# Patient Record
Sex: Male | Born: 2005 | Race: Black or African American | Hispanic: No | Marital: Single | Smoking: Never smoker
Health system: Southern US, Community
[De-identification: ages and names within clinical notes are randomized; demographics above are authoritative.]

## PROBLEM LIST (undated history)

## (undated) ENCOUNTER — Emergency Department (HOSPITAL_COMMUNITY): Admission: EM | Payer: Medicaid Other | Source: Home / Self Care

## (undated) DIAGNOSIS — Q059 Spina bifida, unspecified: Secondary | ICD-10-CM

## (undated) HISTORY — PX: VENTRICULOPERITONEAL SHUNT: SHX204

---
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
## 2023-06-17 ENCOUNTER — Encounter: Payer: Medicaid Other | Attending: Internal Medicine | Admitting: Internal Medicine

## 2023-06-17 ENCOUNTER — Other Ambulatory Visit: Payer: Self-pay | Admitting: Internal Medicine

## 2023-06-17 ENCOUNTER — Ambulatory Visit
Admission: RE | Admit: 2023-06-17 | Discharge: 2023-06-17 | Disposition: A | Discharge status: Home / Self Care | Payer: Medicaid Other | Attending: Internal Medicine | Admitting: Internal Medicine

## 2023-06-17 ENCOUNTER — Ambulatory Visit
Admission: RE | Admit: 2023-06-17 | Discharge: 2023-06-17 | Disposition: A | Discharge status: Home / Self Care | Payer: Medicaid Other | Source: Ambulatory Visit | Attending: Internal Medicine | Admitting: Internal Medicine

## 2023-06-17 DIAGNOSIS — M86171 Other acute osteomyelitis, right ankle and foot: Secondary | ICD-10-CM | POA: Diagnosis present

## 2023-06-17 DIAGNOSIS — Q0701 Arnold-Chiari syndrome with spina bifida: Secondary | ICD-10-CM | POA: Diagnosis not present

## 2023-06-17 DIAGNOSIS — L89613 Pressure ulcer of right heel, stage 3: Secondary | ICD-10-CM | POA: Diagnosis present

## 2023-06-17 DIAGNOSIS — Z981 Arthrodesis status: Secondary | ICD-10-CM | POA: Diagnosis not present

## 2023-06-18 NOTE — Progress Notes (Addendum)
[name] ([id number]) [id number] Page 1 of 9 Visit Report for 06/17/2023 Chief Complaint Document Details Patient Name: Date of Service: [name] 06/17/2023 10:30 A M Medical Record Number: [id number] Patient Account Number: [id number] Date of Birth/Sex: Treating RN: May 16, 2006 (17 y.o. [name] Primary Care Provider: PA Zenovia Jordan, NO Other Clinician: Referring Provider: Treating Provider/Extender: RO BSO N, MICHA EL G GINGRICH, KRISTA Weeks in Treatment: 0 Information Obtained from: Patient Chief Complaint Pressure ulcer left foot due to brace 06/17/2023; patient is here for review of a pressure area on the right plantar heel Electronic Signature(s) Signed: 06/18/2023 3:33:32 PM By: Baltazar Najjar MD Entered By: Baltazar Najjar on 06/17/2023 12:10:17 -------------------------------------------------------------------------------- Debridement Details Patient Name: Date of Service: [name] 06/17/2023 10:30 A M Medical Record Number: [id number] Patient Account Number: [id number] Date of Birth/Sex: Treating RN: Sep 15, 2005 (17 y.o. [name] Primary Care Provider: PA Zenovia Jordan, NO Other Clinician: Referring Provider: Treating Provider/Extender: RO BSO N, MICHA EL G GINGRICH, KRISTA Weeks in Treatment: 0 Debridement Performed for Assessment: Wound #2 Right Calcaneus Performed By: Physician Maxwell Caul, MD Debridement Type: Debridement Level of Consciousness (Pre-procedure): Awake and Alert Pre-procedure Verification/Time Out Yes - 11:19 Taken: Start Time: 11:19 Pain Control: Lidocaine 4% T opical Solution Percent of Wound Bed Debrided: 100% T Area Debrided (cm): otal 0.82 Tissue and other material debrided: Viable, Non-Viable, Eschar, Subcutaneous, Skin: Dermis Level: Skin/Subcutaneous Tissue Debridement Description: Excisional Instrument: Curette Specimen: Swab, Number of Specimens T aken: 1 Bleeding:  Moderate Hemostasis Achieved: Silver Nitrate [name] ([id number]) [id number] Page 2 of 9 Procedural Pain: 0 Post Procedural Pain: 0 Response to Treatment: Procedure was tolerated well Level of Consciousness (Post- Awake and Alert procedure): Post Debridement Measurements of Total Wound Length: (cm) 1.5 Stage: Category/Stage III Width: (cm) 0.7 Depth: (cm) 0.6 Volume: (cm) 0.495 Character of Wound/Ulcer Post Debridement: Stable Post Procedure Diagnosis Same as Pre-procedure Electronic Signature(s) Signed: 06/18/2023 3:33:32 PM By: Baltazar Najjar MD Signed: 06/18/2023 4:52:24 PM By: Midge Aver MSN RN CNS WTA Entered By: Baltazar Najjar on 06/17/2023 12:24:13 -------------------------------------------------------------------------------- HPI Details Patient Name: Date of Service: [name] 06/17/2023 10:30 A M Medical Record Number: [id number] Patient Account Number: [id number] Date of Birth/Sex: Treating RN: 2006/03/26 (17 y.o. [name] Primary Care Provider: PA Zenovia Jordan, NO Other Clinician: Referring Provider: Treating Provider/Extender: RO BSO N, MICHA EL G GINGRICH, KRISTA Weeks in Treatment: 0 History of Present Illness HPI Description: 01/20/2020 upon evaluation today patient presents for initial inspection here in our clinic concerning issues that he has been having with his left foot laterally at the fifth metatarsal location. It appears that he has issues unfortunately with chronic rubbing on his brace. He does have spina bifida and he has issues with this fairly frequently. With that being said the good news is this does not appear to be infected. The bad news is it is a recurrent issue. He is can be having surgery for foot reconstruction June 8. 01/27/2020 on evaluation today patient appears to be doing about the same in regard to his wound at this point. Fortunately there is no signs of active infection. Overall I feel  like he is actually managing quite well. The wound is very macerated I think this is simply due to the fact that he has been using a Band-Aid as opposed to an absorptive bandage over top of the collagen. He really has nothing to drain into and then been putting over  top of this Covan which is occluding it even more. 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 He wears an AFO brace on both feet. Noteworthy that he was here in 2021 for an area on the left foot He tells me that he has had a wound on the plantar right heel for the better part of a year. Prior to this this may have opened and closed repetitively. He has not been specifically dressing this and he became alarmed when it would not close and also that there may have been an odor noticeable. He is here for a review of this. 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 ABI in our clinic was 1.22 on the right Electronic Signature(s) Signed: 06/18/2023 3:33:32 PM By: Baltazar Najjar MD Entered By: Baltazar Najjar on 06/17/2023 12:12:57 [name] ([id number]) [id number] Page 3 of 9 -------------------------------------------------------------------------------- Physical Exam Details Patient Name: Date of Service: [name] 06/17/2023 10:30 A M Medical Record Number: [id number] Patient Account Number: [id number] Date of Birth/Sex: Treating RN: June 15, 2006 (17 y.o. [name] Primary Care Provider: PA Zenovia Jordan, NO Other Clinician: Referring Provider: Treating Provider/Extender: RO BSO N, MICHA EL G GINGRICH, KRISTA Weeks in  Treatment: 0 Constitutional Sitting or standing Blood Pressure is within target range for patient.. Pulse regular and within target range for patient.Marland Kitchen Respirations regular, non-labored and within target range.. Temperature is normal and within the target range for the patient.Marland Kitchen appears in no distress. Respiratory Respiratory effort is easy and symmetric bilaterally. Rate is normal at rest and on room air.. Cardiovascular Pedal pulses are palpable on the right. Notes Wound exam; the areas in the center of the plantar heel. Thick rims of callus around the edge and undermining noted. This does not probe to bone. There is indeed an odor but no purulent drainage. I used a #5 curette to pare down the callus and then remove the overhanging skin. I pared down the resultant subcutaneous tissue. Obtained a specimen for PCR culture. After the procedure this did not probe to bone there was no gross purulence. I noted that his gait really looked surprisingly normal. He has ordinary running shoes on his feet. Electronic Signature(s) Signed: 06/18/2023 3:33:32 PM By: Baltazar Najjar MD Entered By: Baltazar Najjar on 06/17/2023 12:21:59 -------------------------------------------------------------------------------- Physician Orders Details Patient Name: Date of Service: [name] 06/17/2023 10:30 A M Medical Record Number: [id number] Patient Account Number: [id number] Date of Birth/Sex: Treating RN: Dec 28, 2005 (17 y.o. 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 Bathing/ Shower/ Hygiene Clean wound with Normal Saline or wound cleanser. Anesthetic (Use 'Patient Medications' Section for Anesthetic Order Entry) Lidocaine applied to wound bed Off-Loading Other: - Heel offloading shoe  Wound Treatment Wound #2 - Calcaneus Wound Laterality: Right Cleanser: Soap and Water 3 x Per Week/30 Days Discharge Instructions: Gently cleanse wound with antibacterial soap, rinse and pat dry prior to dressing wounds Cleanser: Vashe 5.8 (oz) 3 x Per Week/30 Days Discharge Instructions: Use vashe 5.8 (oz) as directed Prim Dressing: Hydrofera Blue Ready Transfer Foam, 2.5x2.5 (in/in) 3 x Per Week/30 Days ary Discharge Instructions: Apply Hydrofera Blue Ready to wound bed as directed Secondary Dressing: ABD Pad 5x9 (in/in) 3 x Per Week/30 Days Discharge Instructions: Cover with ABD pad Secondary Dressing: Heel Cup 3 x Per Week/30 Days Secured With: Kerlix Roll Sterile or Non-Sterile 6-ply 4.5x4 (yd/yd) 3 x Per Week/30 Days Discharge Instructions: Apply Kerlix as directed Laboratory Bacteria identified in Wound by Culture (MICRO) LOINC Code: [id number] Convenience Name: Wound culture routine Radiology X-ray, foot Electronic Signature(s) Signed: 06/24/2023 4:48:33 PM By: Baltazar Najjar MD Signed: 06/24/2023 5:05:25 PM By: Midge Aver MSN RN CNS WTA Previous Signature: 06/22/2023 4:36:05 PM Version By: Midge Aver MSN RN CNS WTA Previous Signature: 06/18/2023 12:42:02 PM Version By: Midge Aver MSN RN CNS WTA Previous Signature: 06/18/2023 3:33:32 PM Version By: Baltazar Najjar MD Entered By: Midge Aver on 06/24/2023 14:45:14 -------------------------------------------------------------------------------- Problem List Details Patient Name: Date of Service: [name] 06/17/2023 10:30 A M Medical Record Number: [id number] Patient Account  Number: [id number] Date of Birth/Sex: Treating RN: [birth year] (17 y.o. 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 [name] Primary Care Provider: PA TIENT, NO Other Clinician: Referring Provider: Treating Provider/Extender: RO BSO N, MICHA EL [place] Mon, KRISTA Weeks in Treatment: 0 Subjective Chief Complaint Information obtained from Patient Pressure ulcer left foot due to brace 06/17/2023; patient is here for review of a pressure area on the right plantar heel History of Present Illness (HPI) 01/20/2020 upon evaluation today patient presents for initial inspection here in our clinic concerning issues that he has been having with his left foot laterally at the fifth metatarsal location. It appears that he has issues unfortunately with chronic rubbing on his brace. He does have spina bifida and he has issues with this fairly frequently. With that being said the good news is this does not appear to be infected. The bad news is it is a recurrent issue. He is can be having surgery for foot reconstruction June  8. 01/27/2020 on evaluation today patient appears to be doing about the same in regard to his wound at this point. Fortunately there is no signs of active infection. Overall I feel like he is actually managing quite well. The wound is very macerated I think this is simply due to the fact that he has been using a Band-Aid as opposed to an absorptive bandage over top of the collagen. He really has nothing to drain into and then been putting over top  of this Covan which is occluding it even more. 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 He wears an AFO brace on both feet. Noteworthy that he was here in 2021 for an area on the left foot He tells me that he has had a wound on the plantar right heel for the better part of a year. Prior to this this may have opened and closed repetitively. He has not been specifically dressing this and he became alarmed when it would not close and also that there may have been an odor noticeable. He is here for a review of this. 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 ABI in our clinic was 1.22 on the right Patient History Information obtained from Patient. Allergies [name] ([id number]) [id number] Page 6 of 9 No Known Allergies Family History Hypertension - Mother, No family history of Cancer, Diabetes, Heart Disease, Hereditary Spherocytosis, Kidney Disease, Lung Disease, Seizures, Stroke, Thyroid Problems, Tuberculosis. Social History Never smoker, Marital Status - Single, Alcohol Use - Never, Drug Use - No History, Caffeine Use - Never. Medical  History Eyes Denies history of Cataracts, Glaucoma, Optic Neuritis Ear/Nose/Mouth/Throat Denies history of Chronic sinus problems/congestion, Middle ear problems Hematologic/Lymphatic Denies history of Anemia, Hemophilia, Human Immunodeficiency Virus, Lymphedema, Sickle Cell Disease Respiratory Denies history of Aspiration, Asthma, Chronic Obstructive Pulmonary Disease (COPD), Pneumothorax, Sleep Apnea, Tuberculosis Cardiovascular Denies history of Angina, Arrhythmia, Congestive Heart Failure, Coronary Artery Disease, Deep Vein Thrombosis, Hypertension, Hypotension, Myocardial Infarction, Peripheral Arterial Disease, Peripheral Venous Disease, Phlebitis, Vasculitis Gastrointestinal Denies history of Cirrhosis , Colitis, Crohns, Hepatitis A, Hepatitis B, Hepatitis C Endocrine Denies history of Type I Diabetes, Type II Diabetes Genitourinary Denies history of End Stage Renal Disease Immunological Denies history of Lupus Erythematosus, Raynauds, Scleroderma Integumentary (Skin) Denies history of History of Burn, History of pressure wounds Musculoskeletal Denies history of Gout, Rheumatoid Arthritis, Osteoarthritis, Osteomyelitis Neurologic Denies history of Dementia, Neuropathy, Quadriplegia, Paraplegia, Seizure Disorder Oncologic Denies history of Received Chemotherapy, Received Radiation Psychiatric Denies history of Anorexia/bulimia, Confinement Anxiety Objective Constitutional Sitting or standing Blood Pressure is within target range for patient.. Pulse regular and within target range for patient.Marland Kitchen Respirations regular, non-labored and within target range.. Temperature is normal and within the target range for the patient.Marland Kitchen appears in no distress. Vitals Time Taken: 10:49 AM, Height: 67 in, Source: Stated, Weight: 130 lbs, Source: Stated, BMI: 20.4, Temperature: 98.21 F, Pulse: 67 bpm, Respiratory Rate: 16 breaths/min, Blood Pressure: 98/64 mmHg. Respiratory Respiratory effort  is easy and symmetric bilaterally. Rate is normal at rest and on room air.. Cardiovascular Pedal pulses are palpable on the right. General Notes: Wound exam; the areas in the center of the plantar heel. Thick rims of callus around the edge and undermining noted. This does not probe to bone. There is indeed an odor but no purulent drainage. I used a #5 curette to pare down the callus and then remove the overhanging skin. I pared down the resultant subcutaneous tissue. Obtained a specimen for PCR culture. After the procedure this did not probe to bone there was no gross purulence. I noted that his gait really looked surprisingly normal. He has ordinary running shoes  on his feet. Integumentary (Hair, Skin) Wound #2 status is Open. Original cause of wound was Pressure Injury. The date acquired was: 06/16/2022. The wound is located on the Right Calcaneus. The wound measures 1.5cm length x 0.7cm width x 0.6cm depth; 0.825cm^2 area and 0.495cm^3 volume. There is Fat Layer (Subcutaneous Tissue) exposed. There is no tunneling noted, however, there is undermining starting at 6:00 and ending at 12:00 with a maximum distance of 1.2cm. There is a medium amount of sanguinous drainage noted. Foul odor after cleansing was noted. There is small (1-33%) red, pink granulation within the wound bed. Assessment Active Problems ICD-10 Pressure ulcer of right heel, stage 3 Lumbar spina bifida without hydrocephalus [name] ([id number]) [phone].pdf Page 7 of 9 Procedures Wound #2 Pre-procedure diagnosis of Wound #2 is a Pressure Ulcer located on the Right Calcaneus . There was a Excisional Skin/Subcutaneous Tissue/Muscle/Bone Debridement with a total area of 0.82 sq cm performed by Maxwell Caul, MD. With the following instrument(s): Curette to remove Viable and Non- Viable tissue/material. Material removed includes Cartilage after achieving pain control using Lidocaine 4% T opical  Solution. 1 specimen was taken by a Swab and sent to the lab per facility protocol. A time out was conducted at 11:19, prior to the start of the procedure. A Moderate amount of bleeding was controlled with Silver Nitrate. The procedure was tolerated well with a pain level of 0 throughout and a pain level of 0 following the procedure. Post Debridement Measurements: 1.5cm length x 0.7cm width x 0.6cm depth; 0.495cm^3 volume. Post debridement Stage noted as Category/Stage III. Character of Wound/Ulcer Post Debridement is stable. Post procedure Diagnosis Wound #2: Same as Pre-Procedure Plan 1. 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 2. Required an extensive debridement as outlined hemostasis with silver nitrate and direct pressure 3. Because of the odor and the chronicity I obtained a specimen of subcutaneous tissue for PCR culture 4. No empiric antibiotics however I would like to obtain an x-ray of the heel. 5. Dress this wound with Hydrofera Blue heel cup and kerlix. The patient will change this every day. We offloaded this with a heel offloading shoe however a total contact cast may be necessary after we have a chance to evaluate the PCR culture and x-ray 6. As noted his gait is really quite normal looking. He is somewhat insensate in this area Electronic Signature(s) Signed: 06/18/2023 3:33:32 PM By: Baltazar Najjar MD Entered By: Baltazar Najjar on 06/17/2023 12:23:32 -------------------------------------------------------------------------------- ROS/PFSH Details Patient Name: Date of Service: [name] 06/17/2023 10:30 A M Medical Record Number: [id number] Patient Account Number: [id number] Date of Birth/Sex: Treating RN: 05-31-2006 (17 y.o. [name] Primary Care Provider: PA Zenovia Jordan, NO Other Clinician: Referring Provider: Treating Provider/Extender: RO BSO N, MICHA EL G GINGRICH, KRISTA Weeks in Treatment: 0 Information Obtained  From Patient Eyes Medical History: Negative for: Cataracts; Glaucoma; Optic Neuritis Ear/Nose/Mouth/Throat Medical History: Negative for: Chronic sinus problems/congestion; Middle ear problems Hematologic/Lymphatic Medical History: Negative for: Anemia; Hemophilia; Human Immunodeficiency Virus; Lymphedema; Sickle Cell Disease MAYCO, SOBALVARRO ([id number]) [id number] Page 8 of 9 Respiratory Medical History: Negative for: Aspiration; Asthma; Chronic Obstructive Pulmonary Disease (COPD); Pneumothorax; Sleep Apnea; Tuberculosis Cardiovascular Medical History: Negative for: Angina; Arrhythmia; Congestive Heart Failure; Coronary Artery Disease; Deep Vein Thrombosis; Hypertension; Hypotension; Myocardial Infarction; Peripheral Arterial Disease; Peripheral Venous Disease; Phlebitis; Vasculitis Gastrointestinal Medical History: Negative for: Cirrhosis ; Colitis; Crohns; Hepatitis A; Hepatitis B; Hepatitis C Endocrine Medical History: Negative for: Type I Diabetes;  Type II Diabetes Genitourinary Medical History: Negative for: End Stage Renal Disease Immunological Medical History: Negative for: Lupus Erythematosus; Raynauds; Scleroderma Integumentary (Skin) Medical History: Negative for: History of Burn; History of pressure wounds Musculoskeletal Medical History: Negative for: Gout; Rheumatoid Arthritis; Osteoarthritis; Osteomyelitis Neurologic Medical History: Negative for: Dementia; Neuropathy; Quadriplegia; Paraplegia; Seizure Disorder Oncologic Medical History: Negative for: Received Chemotherapy; Received Radiation Psychiatric Medical History: Negative for: Anorexia/bulimia; Confinement Anxiety Immunizations Pneumococcal Vaccine: Received Pneumococcal Vaccination: No Implantable Devices None Family and Social History Cancer: No; Diabetes: No; Heart Disease: No; Hereditary Spherocytosis: No; Hypertension: Yes - Mother; Kidney Disease: No; Lung  Disease: No; Seizures: No; Stroke: No; Thyroid Problems: No; Tuberculosis: No; Never smoker; Marital Status - Single; Alcohol Use: Never; Drug Use: No History; Caffeine Use: Never; Financial Concerns: No; Food, Clothing or Shelter Needs: No; Support System Lacking: No; Transportation Concerns: No Psychologist, prison and probation services) Signed: 06/18/2023 3:33:32 PM By: Baltazar Najjar MD Signed: 06/18/2023 4:52:24 PM By: Midge Aver MSN RN CNS WTA Entered By: Midge Aver on 06/17/2023 11:00:29 [name] ([id number]) [id number] Page 9 of 9 -------------------------------------------------------------------------------- SuperBill Details Patient Name: Date of Service: [name] 06/17/2023 Medical Record Number: [id number] Patient Account Number: [id number] Date of Birth/Sex: Treating RN: July 20, 2006 (17 y.o. 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, stage 3 Modifier: Quantity: 1 : CPT4 Code: [id number] Description: [facility] Modifier: Quantity: 1 Physician Procedures : CPT4 Code Description Modifier [id number] 99204 - WC PHYS LEVEL 4 - Avery PT 25 ICD-10 Diagnosis Description 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

## 2023-06-18 NOTE — Progress Notes (Signed)
NERIAH, SCHACHER ([id number]) [phone] [email or web address] Page 1 of 5 Visit Report for 06/17/2023 Abuse Risk Screen Details Patient Name: Date of Service: [name] 06/17/2023 10:30 A M Medical Record Number: [id number] Patient Account Number: [id number] Date of Birth/Sex: Treating RN: 30-Dec-2005 (17 y.o. [name] Primary Care Lolah Coghlan: PA Zenovia Jordan, NO Other Clinician: Referring Magie Ciampa: Treating Saray Capasso/Extender: RO BSO N, MICHA EL Cranford Mon, KRISTA Weeks in Treatment: 0 Abuse Risk Screen Items Answer Electronic Signature(s) Signed: 06/18/2023 4:52:24 PM By: Midge Aver MSN RN CNS WTA Entered By: Midge Aver on 06/17/2023 11:00:34 -------------------------------------------------------------------------------- Activities of Daily Living Details Patient Name: Date of Service: [name] 06/17/2023 10:30 A M Medical Record Number: [id number] Patient Account Number: [id number] Date of Birth/Sex: Treating RN: October 22, 2005 (17 y.o. [name] Primary Care Hernandez [place]: PA Zenovia Jordan, NO Other Clinician: Referring Annet Manukyan: Treating Heran Campau/Extender: RO BSO N, MICHA EL G GINGRICH, KRISTA Weeks in Treatment: 0 Activities of Daily Living Items Answer Activities of Daily Living (Please select one for each item) Drive Automobile Not Able T Medications ake Completely Able Use T elephone Completely Able Care for Appearance Completely Able Use T oilet Completely Able Bath / Shower Completely Able Dress Self Completely Able Feed Self Completely Able Walk Completely Able Get In / Out Bed Completely Able Housework Completely Able Prepare Meals Completely Able Handle Money Completely Able Shop for Self Completely VIDYUTH, MION ([id number]) [phone] [id number] Page 2 of 5 Electronic Signature(s) Signed: 06/18/2023 4:52:24 PM By: Midge Aver MSN RN CNS WTA Entered By: Midge Aver on 06/17/2023  11:01:02 -------------------------------------------------------------------------------- Education Screening Details Patient Name: Date of Service: [name] 06/17/2023 10:30 A M Medical Record Number: [id number] Patient Account Number: [id number] Date of Birth/Sex: Treating RN: 05-14-2006 (17 y.o. [name] Primary Care Chase Arnall: PA Zenovia Jordan, NO Other Clinician: Referring [place]: Treating Mercedes Fort/Extender: RO BSO N, MICHA EL Cranford Mon, KRISTA Weeks in Treatment: 0 Learning Preferences/Education Level/Primary Language Learning Preference: Explanation Preferred Language: English Cognitive Barrier Language Barrier: No Translator Needed: No Memory Deficit: No Emotional Barrier: No Cultural/Religious Beliefs Affecting Medical Care: No Physical Barrier Impaired Vision: No Impaired Hearing: No Decreased Hand dexterity: No Knowledge/Comprehension Knowledge Level: High Comprehension Level: High Ability to understand written instructions: High Ability to understand verbal instructions: High Motivation Anxiety Level: Calm Cooperation: Cooperative Education Importance: Acknowledges Need Interest in Health Problems: Asks Questions Perception: Coherent Willingness to Engage in Self-Management High Activities: Readiness to Engage in Self-Management High Activities: Electronic Signature(s) Signed: 06/18/2023 4:52:24 PM By: Midge Aver MSN RN CNS WTA Entered By: Midge Aver on 06/17/2023 11:01:26 [name] ([id number]) [phone] [id number] Page 3 of 5 -------------------------------------------------------------------------------- Fall Risk Assessment Details Patient Name: Date of Service: [name] 06/17/2023 10:30 A M Medical Record Number: [id number] Patient Account Number: [id number] Date of Birth/Sex: Treating RN: 07-01-2006 (17 y.o. [name] Primary Care Cedar Roseman: PA TIENT, NO Other Clinician: Referring  Ananya Mccleese: Treating Noor Witte/Extender: RO BSO N, MICHA EL G GINGRICH, KRISTA Weeks in Treatment: 0 Fall Risk Assessment Items Have you had 2 or more falls in the last 12 monthso 0 No Have you had any fall that resulted in injury in the last 12 monthso 0 No FALLS RISK SCREEN History of falling - immediate or within 3 months 0 No Secondary diagnosis (Do you have 2 or more medical diagnoseso) 0 No Ambulatory aid None/bed rest/wheelchair/nurse 0 No Crutches/cane/walker 0 No Furniture 0 No Intravenous therapy Access/Saline/Heparin Lock 0 No Gait/Transferring  Normal/ bed rest/ wheelchair 0 No Weak (short steps with or without shuffle, stooped but able to lift head while walking, may seek 0 No support from furniture) Impaired (short steps with shuffle, may have difficulty arising from chair, head down, impaired 0 No balance) Mental Status Oriented to own ability 0 Yes Electronic Signature(s) Signed: 06/18/2023 4:52:24 PM By: Midge Aver MSN RN CNS WTA Entered By: Midge Aver on 06/17/2023 11:01:41 -------------------------------------------------------------------------------- Foot Assessment Details Patient Name: Date of Service: [name] 06/17/2023 10:30 A M Medical Record Number: [id number] Patient Account Number: [id number] Date of Birth/Sex: Treating RN: 2005/09/12 (17 y.o. [name] Primary Care Daesha Insco: PA Zenovia Jordan, NO Other Clinician: Referring Autie Vasudevan: Treating Fredricka Kohrs/Extender: RO BSO N, MICHA EL Cranford Mon, KRISTA Weeks in Treatment: 0 Foot Assessment Items Site Locations [place], Cleophas Dunker ([id number]) [phone] [email or web address] Page 4 of 5 + = Sensation present, - = Sensation absent, C = Callus, U = Ulcer R = Redness, W = Warmth, M = Maceration, PU = Pre-ulcerative lesion F = Fissure, S = Swelling, D = Dryness Assessment Right: Left: Other Deformity: No No Prior Foot Ulcer: No No Prior Amputation: No No Charcot Joint: No  No Ambulatory Status: Ambulatory Without Help Gait: Steady Electronic Signature(s) Signed: 06/18/2023 4:52:24 PM By: Midge Aver MSN RN CNS WTA Entered By: Midge Aver on 06/17/2023 11:02:51 -------------------------------------------------------------------------------- Nutrition Risk Screening Details Patient Name: Date of Service: [name] 06/17/2023 10:30 A M Medical Record Number: [id number] Patient Account Number: [id number] Date of Birth/Sex: Treating RN: Jun 10, 2006 (17 y.o. [name] Primary Care Aarit Kashuba: PA TIENT, NO Other Clinician: Referring Pardeep Pautz: Treating Kijana Estock/Extender: RO BSO N, MICHA EL G GINGRICH, KRISTA Weeks in Treatment: 0 Height (in): 67 Weight (lbs): 130 Body Mass Index (BMI): 20.4 Nutrition Risk Screening Items Score Screening NUTRITION RISK SCREEN: I have an illness or condition that made me change the kind and/or amount of food I eat 0 No I eat fewer than two meals per day 0 No I eat few fruits and vegetables, or milk products 0 No I have three or more drinks of beer, liquor or wine almost every day 0 No I have tooth or mouth problems that make it hard for me to eat 0 No I don't always have enough money to buy the food I need 0 No [name] ([id number]) [phone] [id number] Page 5 of 5 I eat alone most of the time 0 No I take three or more different prescribed or over-the-counter drugs a day 1 Yes Without wanting to, I have lost or gained 10 pounds in the last six months 0 No I am not always physically able to shop, cook and/or feed myself 0 No Nutrition Protocols Good Risk Protocol 0 No interventions needed Moderate Risk Protocol High Risk Proctocol Risk Level: Good Risk Score: 1 Electronic Signature(s) Signed: 06/18/2023 4:52:24 PM By: Midge Aver MSN RN CNS WTA Entered By: Midge Aver on 06/17/2023 11:02:14

## 2023-06-18 NOTE — Progress Notes (Signed)
[name] ([id number]) [id number] Page 1 of 10 Visit Report for 06/17/2023 Allergy List Details Patient Name: Date of Service: [name] 06/17/2023 10:30 A M Medical Record Number: [id number] Patient Account Number: [id number] Date of Birth/Sex: Treating RN: 2005-09-03 (17 y.o. [name] Primary Care Yaret Hush: PA Zenovia Jordan, NO Other Clinician: Referring Trudi Morgenthaler: Treating Amberlynn Tempesta/Extender: RO BSO N, MICHA EL G GINGRICH, KRISTA Weeks in Treatment: 0 Allergies Active Allergies No Known Allergies Allergy Notes Electronic Signature(s) Signed: 06/18/2023 4:52:24 PM By: Midge Aver MSN RN CNS WTA Entered By: Midge Aver on 06/17/2023 07:58:24 -------------------------------------------------------------------------------- Arrival Information Details Patient Name: Date of Service: [name] 06/17/2023 10:30 A M Medical Record Number: [id number] Patient Account Number: [id number] Date of Birth/Sex: Treating RN: 2006-08-14 (17 y.o. [name] Primary Care Maanav Kassabian: PA Zenovia Jordan, NO Other Clinician: Referring Tayt Moyers: Treating Michal Strzelecki/Extender: RO BSO N, MICHA EL [place] Mon, KRISTA Weeks in Treatment: 0 Visit Information Patient Arrived: Ambulatory Arrival Time: 10:47 Accompanied By: SELF Transfer Assistance: Manual Patient Identification Verified: Yes Secondary Verification Process Completed: Yes Patient Requires Transmission-Based Precautions: No Patient Has Alerts: No History Since Last Visit Added or deleted any medications: No Any Avery allergies or adverse reactions: No Has Dressing in Place as Prescribed: Yes Pain Present Now: No Electronic Signature(s) Signed: 06/18/2023 4:52:24 PM By: Midge Aver MSN RN CNS WTA Entered By: Midge Aver on 06/17/2023 07:48:12 [name] ([id number]) [id number] Page 2 of  10 -------------------------------------------------------------------------------- Clinic Level of Care Assessment Details Patient Name: Date of Service: [name] 06/17/2023 10:30 A M Medical Record Number: [id number] Patient Account Number: [id number] Date of Birth/Sex: Treating RN: Jul 26, 2006 (17 y.o. [name] Primary Care Shloimy Michalski: PA Zenovia Jordan, NO Other Clinician: Referring Teodor Prater: Treating Adali Pennings/Extender: RO BSO N, MICHA EL G GINGRICH, KRISTA Weeks in Treatment: 0 Clinic Level of Care Assessment Items TOOL 1 Quantity Score X- 1 0 Use when EandM and Procedure is performed on INITIAL visit ASSESSMENTS - Nursing Assessment / Reassessment X- 1 20 General Physical Exam (combine w/ comprehensive assessment (listed just below) when performed on Avery pt. evals) X- 1 25 Comprehensive Assessment (HX, ROS, Risk Assessments, Wounds Hx, etc.) ASSESSMENTS - Wound and Skin Assessment / Reassessment []  - 0 Dermatologic / Skin Assessment (not related to wound area) ASSESSMENTS - Ostomy and/or Continence Assessment and Care []  - 0 Incontinence Assessment and Management []  - 0 Ostomy Care Assessment and Management (repouching, etc.) PROCESS - Coordination of Care X - Simple Patient / Family Education for ongoing care 1 15 []  - 0 Complex (extensive) Patient / Family Education for ongoing care X- 1 10 Staff obtains Chiropractor, Records, T Results / Process Orders est []  - 0 Staff telephones HHA, Nursing Homes / Clarify orders / etc []  - 0 Routine Transfer to another Facility (non-emergent condition) []  - 0 Routine Hospital Admission (non-emergent condition) X- 1 15 Avery Admissions / Manufacturing engineer / Ordering NPWT Apligraf, etc. , []  - 0 Emergency Hospital Admission (emergent condition) PROCESS - Special Needs []  - 0 Pediatric / Minor Patient Management []  - 0 Isolation Patient Management []  - 0 Hearing / Language / Visual special needs []  - 0 Assessment  of Community assistance (transportation, D/C planning, etc.) []  - 0 Additional assistance / Altered mentation []  - 0 Support Surface(s) Assessment (bed, cushion, seat, etc.) INTERVENTIONS - Miscellaneous []  - 0 External ear exam []  - 0 Patient Transfer (multiple staff / Nurse, adult / Similar devices) []  - 0  Simple Staple / Suture removal (25 or less) []  - 0 Complex Staple / Suture removal (26 or more) []  - 0 Hypo/Hyperglycemic Management (do not check if billed separately) [name] ([id number]) [id number] Page 3 of 10 X- 1 15 Ankle / Brachial Index (ABI) - do not check if billed separately Has the patient been seen at the hospital within the last three years: Yes Total Score: 100 Level Of Care: Avery/Established - Level 3 Electronic Signature(s) Signed: 06/18/2023 4:52:24 PM By: Midge Aver MSN RN CNS WTA Entered By: Midge Aver on 06/18/2023 09:42:34 -------------------------------------------------------------------------------- Encounter Discharge Information Details Patient Name: Date of Service: [name] 06/17/2023 10:30 A M Medical Record Number: [id number] Patient Account Number: [id number] Date of Birth/Sex: Treating RN: 11-28-2005 (17 y.o. [name] Primary Care Keanon Bevins: PA Zenovia Jordan, NO Other Clinician: Referring Dereck Agerton: Treating Briunna Leicht/Extender: RO BSO N, MICHA EL G GINGRICH, KRISTA Weeks in Treatment: 0 Encounter Discharge Information Items Post Procedure Vitals Discharge Condition: Stable Temperature (F): 98.2 Ambulatory Status: Ambulatory Pulse (bpm): 67 Discharge Destination: Home Respiratory Rate (breaths/min): 18 Transportation: Private Auto Blood Pressure (mmHg): 98/64 Accompanied By: self Schedule Follow-up Appointment: Yes Clinical Summary of Care: Electronic Signature(s) Signed: 06/18/2023 12:46:51 PM By: Midge Aver MSN RN CNS WTA Entered By: Midge Aver on 06/18/2023  09:46:51 -------------------------------------------------------------------------------- Lower Extremity Assessment Details Patient Name: Date of Service: [name] 06/17/2023 10:30 A M Medical Record Number: [id number] Patient Account Number: [id number] Date of Birth/Sex: Treating RN: 2005-12-24 (17 y.o. [name] Primary Care Shalom Mcguiness: PA Zenovia Jordan, NO Other Clinician: Referring Phu Record: Treating Chukwudi Ewen/Extender: RO BSO N, MICHA EL G GINGRICH, KRISTA Weeks in Treatment: 0 Vascular Assessment Extremity colors, hair growth, and conditions: Extremity Color: [Right:Normal] Hair Growth on Extremity: [Right:Yes] Temperature of Extremity: [Right:Warm] [name] ([id number]) [Right:[id number] Page 4 of 10] Capillary Refill: [Right:< 3 seconds] Dependent Rubor: [Right:No] Blanched when Elevated: [Right:No] Lipodermatosclerosis: [Right:No] Blood Pressure: Brachial: [Right:98] Ankle: [Right:Dorsalis Pedis: 120 1.22] Toe Nail Assessment Left: Right: Thick: No Discolored: No Deformed: No Improper Length and Hygiene: No Electronic Signature(s) Signed: 06/18/2023 4:52:24 PM By: Midge Aver MSN RN CNS WTA Entered By: Midge Aver on 06/17/2023 08:16:21 -------------------------------------------------------------------------------- Multi Wound Chart Details Patient Name: Date of Service: [name] 06/17/2023 10:30 A M Medical Record Number: [id number] Patient Account Number: [id number] Date of Birth/Sex: Treating RN: 28-Feb-2006 (17 y.o. [name] Primary Care Janelys Glassner: PA TIENT, NO Other Clinician: Referring Hubert Derstine: Treating Deeksha Cotrell/Extender: RO BSO N, MICHA EL G GINGRICH, KRISTA Weeks in Treatment: 0 Vital Signs Height(in): 67 Pulse(bpm): 67 Weight(lbs): 130 Blood Pressure(mmHg): 98/64 Body Mass Index(BMI): 20.4 Temperature(F): 98.21 Respiratory Rate(breaths/min): 16 [2:Photos:] [N/A:N/A] Right Calcaneus N/A  N/A Wound Location: Pressure Injury N/A N/A Wounding Event: Pressure Ulcer N/A N/A Primary Etiology: 06/16/2022 N/A N/A Date Acquired: 0 N/A N/A Weeks of Treatment: Open N/A N/A Wound Status: No N/A N/A Wound Recurrence: 1.5x0.7x0.6 N/A N/A Measurements L x W x D (cm) 0.825 N/A N/A A (cm) : rea 0.495 N/A N/A Volume (cm) : 6 Starting Position 1 (o'clock): 12 Ending Position 1 (o'clock): 1.2 Maximum Distance 1 (cm): Yes N/A N/A UnderminingEula Avery ([id number]) [id number] Page 5 of 10 Category/Stage III N/A N/A Classification: Medium N/A N/A Exudate A mount: Sanguinous N/A N/A Exudate Type: red N/A N/A Exudate Color: Yes N/A N/A Foul Odor A Cleansing: fter No N/A N/A Odor A nticipated Due to Product Use: Small (1-33%) N/A N/A Granulation A mount: Red, Pink N/A N/A  Granulation Quality: Fat Layer (Subcutaneous Tissue): Yes N/A N/A Exposed Structures: Fascia: No Tendon: No Muscle: No Joint: No Bone: No None N/A N/A Epithelialization: Debridement - Excisional N/A N/A Debridement: Pre-procedure Verification/Time Out 11:19 N/A N/A Taken: Lidocaine 4% T opical Solution N/A N/A Pain Control: Cartilage N/A N/A Tissue Debrided: Skin/Subcutaneous N/A N/A Level: Tissue/Muscle/Bone 0.82 N/A N/A Debridement A (sq cm): rea Curette N/A N/A Instrument: Swab N/A N/A Specimen: 1 N/A N/A Number of Specimens Taken: Moderate N/A N/A Bleeding: Silver Nitrate N/A N/A Hemostasis Achieved: 0 N/A N/A Procedural Pain: 0 N/A N/A Post Procedural Pain: Debridement Treatment Response: Procedure was tolerated well N/A N/A Post Debridement Measurements L x 1.5x0.7x0.6 N/A N/A W x D (cm) 0.495 N/A N/A Post Debridement Volume: (cm) Category/Stage III N/A N/A Post Debridement Stage: Debridement N/A N/A Procedures Performed: Treatment Notes Electronic Signature(s) Signed: 06/18/2023 3:33:32 PM By: Baltazar Najjar MD Entered  By: Baltazar Najjar on 06/17/2023 09:09:20 -------------------------------------------------------------------------------- Multi-Disciplinary Care Plan Details Patient Name: Date of Service: [name] 06/17/2023 10:30 A M Medical Record Number: [id number] Patient Account Number: [id number] Date of Birth/Sex: Treating RN: 03/07/2006 (17 y.o. [name] Primary Care Adyan Palau: PA Zenovia Jordan, NO Other Clinician: Referring Jlyn Cerros: Treating Hanako Tipping/Extender: RO BSO N, MICHA EL G GINGRICH, KRISTA Weeks in Treatment: 0 Active Inactive Necrotic Tissue Nursing Diagnoses: Impaired tissue integrity related to necrotic/devitalized tissue Knowledge deficit related to management of necrotic/devitalized tissue Goals: Necrotic/devitalized tissue will be minimized in the wound bed [name] ([id number]) [phone].pdf Page 6 of 10 Date Initiated: 06/18/2023 Target Resolution Date: 07/18/2023 Goal Status: Active Patient/caregiver will verbalize understanding of reason and process for debridement of necrotic tissue Date Initiated: 06/18/2023 Target Resolution Date: 07/18/2023 Goal Status: Active Interventions: Assess patient pain level pre-, during and post procedure and prior to discharge Provide education on necrotic tissue and debridement process Treatment Activities: Excisional debridement : 06/17/2023 Notes: Pressure Nursing Diagnoses: Knowledge deficit related to causes and risk factors for pressure ulcer development Knowledge deficit related to management of pressures ulcers Potential for impaired tissue integrity related to pressure, friction, moisture, and shear Goals: Patient will remain free from development of additional pressure ulcers Date Initiated: 06/18/2023 Target Resolution Date: 07/18/2023 Goal Status: Active Patient will remain free of pressure ulcers Date Initiated: 06/18/2023 Target Resolution Date: 07/18/2023 Goal Status:  Active Patient/caregiver will verbalize risk factors for pressure ulcer development Date Initiated: 06/18/2023 Target Resolution Date: 07/11/2023 Goal Status: Active Patient/caregiver will verbalize understanding of pressure ulcer management Date Initiated: 06/18/2023 Target Resolution Date: 06/17/2023 Goal Status: Active Interventions: Assess offloading mechanisms upon admission and as needed Assess potential for pressure ulcer upon admission and as needed Provide education on pressure ulcers Notes: Wound/Skin Impairment Nursing Diagnoses: Impaired tissue integrity Knowledge deficit related to ulceration/compromised skin integrity Goals: Patient/caregiver will verbalize understanding of skin care regimen Date Initiated: 06/18/2023 Target Resolution Date: 07/18/2023 Goal Status: Active Ulcer/skin breakdown will have a volume reduction of 30% by week 4 Date Initiated: 06/18/2023 Target Resolution Date: 07/18/2023 Goal Status: Active Ulcer/skin breakdown will have a volume reduction of 50% by week 8 Date Initiated: 06/18/2023 Target Resolution Date: 08/17/2023 Goal Status: Active Ulcer/skin breakdown will have a volume reduction of 80% by week 12 Date Initiated: 06/18/2023 Target Resolution Date: 09/17/2023 Goal Status: Active Ulcer/skin breakdown will heal within 14 weeks Date Initiated: 06/18/2023 Target Resolution Date: 10/01/2023 Goal Status: Active Interventions: Assess patient/caregiver ability to obtain necessary supplies Assess patient/caregiver ability to perform ulcer/skin care regimen upon admission and as needed Assess ulceration(s)  every visit Provide education on ulcer and skin care Treatment Activities: Skin care regimen initiated : 06/17/2023 Notes: BODEY, LAVANWAY ([id number]) [phone].pdf Page 7 of 10 Electronic Signature(s) Signed: 06/18/2023 12:45:33 PM By: Midge Aver MSN RN CNS WTA Previous Signature: 06/18/2023 12:45:27 PM  Version By: Midge Aver MSN RN CNS WTA Entered By: Midge Aver on 06/18/2023 09:45:32 -------------------------------------------------------------------------------- Pain Assessment Details Patient Name: Date of Service: [name] 06/17/2023 10:30 A M Medical Record Number: [id number] Patient Account Number: [id number] Date of Birth/Sex: Treating RN: [birth year] (17 y.o. [name] Primary Care Torian Quintero: PA Zenovia Jordan, NO Other Clinician: Referring Jaire Pinkham: Treating [place] Basham/Extender: RO BSO N, MICHA EL G GINGRICH, KRISTA Weeks in Treatment: 0 Active Problems Location of Pain Severity and Description of Pain Patient Has Paino Yes Site Locations Rate the pain. Current Pain Level: 6 Pain Management and Medication Current Pain Management: Electronic Signature(s) Signed: 06/18/2023 4:52:24 PM By: Midge Aver MSN RN CNS WTA Entered By: Midge Aver on 06/17/2023 07:49:08 Patient/Caregiver Education Details -------------------------------------------------------------------------------- [name] ([id number]) [id number] Page 8 of 10 Patient Name: Date of Service: [name] 10/23/2024andnbsp10:30 A M Medical Record Number: [id number] Patient Account Number: [id number] Date of Birth/Gender: Treating RN: 11-Jun-2006 (17 y.o. [name] Primary Care Physician: PA Zenovia Jordan, NO Other Clinician: Referring Physician: Treating Physician/Extender: RO BSO N, MICHA EL [place] Mon, KRISTA Weeks in Treatment: 0 Education Assessment Education Provided To: Patient Education Topics Provided Wound/Skin Impairment: Handouts: Caring for Your Ulcer Methods: Explain/Verbal Responses: State content correctly Electronic Signature(s) Signed: 06/18/2023 4:52:24 PM By: Midge Aver MSN RN CNS WTA Entered By: Midge Aver on 06/18/2023 09:45:44 -------------------------------------------------------------------------------- Wound Assessment  Details Patient Name: Date of Service: [name] 06/17/2023 10:30 A M Medical Record Number: [id number] Patient Account Number: [id number] Date of Birth/Sex: Treating RN: [birth year] (17 y.o. [name] Primary Care Mohammedali Bedoy: PA TIENT, NO Other Clinician: Referring Lamiah Marmol: Treating Zira Helinski/Extender: RO BSO N, MICHA EL G GINGRICH, KRISTA Weeks in Treatment: 0 Wound Status Wound Number: 2 Primary Etiology: Pressure Ulcer Wound Location: Right Calcaneus Wound Status: Open Wounding Event: Pressure Injury Date Acquired: 06/16/2022 Weeks Of Treatment: 0 Clustered Wound: No Photos Wound Measurements Length: (cm) 1.5 Width: (cm) 0.7 Depth: (cm) 0.6 Area: (cm) 0.825 Raggio, Zameer ([id number]) Volume: (cm) % Reduction in Area: % Reduction in Volume: Epithelialization: None Tunneling: No [id number] Page 9 of 10 0.495 Undermining: Yes Starting Position (o'clock): 6 Ending Position (o'clock): 12 Maximum Distance: (cm) 1.2 Wound Description Classification: Category/Stage III Exudate Amount: Medium Exudate Type: Sanguinous Exudate Color: red Foul Odor After Cleansing: Yes Due to Product Use: No Wound Bed Granulation Amount: Small (1-33%) Exposed Structure Granulation Quality: Red, Pink Fascia Exposed: No Fat Layer (Subcutaneous Tissue) Exposed: Yes Tendon Exposed: No Muscle Exposed: No Joint Exposed: No Bone Exposed: No Treatment Notes Wound #2 (Calcaneus) Wound Laterality: Right Cleanser Soap and Water Discharge Instruction: Gently cleanse wound with antibacterial soap, rinse and pat dry prior to dressing wounds Vashe 5.8 (oz) Discharge Instruction: Use vashe 5.8 (oz) as directed Peri-Wound Care Topical Primary Dressing Hydrofera Blue Ready Transfer Foam, 2.5x2.5 (in/in) Discharge Instruction: Apply Hydrofera Blue Ready to wound bed as directed Secondary Dressing ABD Pad 5x9 (in/in) Discharge Instruction: Cover with ABD  pad Heel Cup Secured With [employer] Sterile or Non-Sterile 6-ply 4.5x4 (yd/yd) Discharge Instruction: Apply Kerlix as directed Compression Wrap Compression Stockings Add-Ons Electronic Signature(s) Signed: 06/18/2023 4:52:24 PM By: Midge Aver MSN RN CNS WTA Entered By: Midge Aver  on 06/17/2023 08:15:58 -------------------------------------------------------------------------------- Vitals Details Patient Name: Date of Service: [name] 06/17/2023 10:30 A Sondra Barges ([id number]) [id number] Page 10 of 10 Medical Record Number: [id number] Patient Account Number: [id number] Date of Birth/Sex: Treating RN: 07/19/2006 (17 y.o. [name] Primary Care Angelic Schnelle: PA TIENT, NO Other Clinician: Referring Halley Shepheard: Treating Zsofia Prout/Extender: RO BSO N, MICHA EL G GINGRICH, KRISTA Weeks in Treatment: 0 Vital Signs Time Taken: 10:49 Temperature (F): 98.21 Height (in): 67 Pulse (bpm): 67 Source: Stated Respiratory Rate (breaths/min): 16 Weight (lbs): 130 Blood Pressure (mmHg): 98/64 Source: Stated Reference Range: 80 - 120 mg / dl Body Mass Index (BMI): 20.4 Electronic Signature(s) Signed: 06/18/2023 4:52:24 PM By: Midge Aver MSN RN CNS WTA Entered By: Midge Aver on 06/17/2023 07:51:42

## 2023-06-24 ENCOUNTER — Encounter: Payer: Medicaid Other | Admitting: Internal Medicine

## 2023-06-24 DIAGNOSIS — L89613 Pressure ulcer of right heel, stage 3: Secondary | ICD-10-CM | POA: Diagnosis not present

## 2023-06-25 NOTE — Progress Notes (Signed)
[name] ([id number]) [id number] Page 1 of 6 Visit Report for 06/24/2023 HPI Details Patient Name: Date of Service: [name] 06/24/2023 1:15 PM Medical Record Number: [id number] Patient Account Number: [id number] Date of Birth/Sex: Treating RN: [birth year] (17 y.o. [name] Primary Care Provider: PA Zenovia Jordan, NO Other Clinician: Referring Provider: Treating Provider/Extender: RO BSO N, MICHA EL G GINGRICH, KRISTA Weeks in Treatment: 1 History of Present Illness HPI Description: 01/20/2020 upon evaluation today patient presents for initial inspection here in our clinic concerning issues that he has been having with his left foot laterally at the fifth metatarsal location. It appears that he has issues unfortunately with chronic rubbing on his brace. He does have spina bifida and he has issues with this fairly frequently. With that being said the good news is this does not appear to be infected. The bad news is it is a recurrent issue. He is can be having surgery for foot reconstruction June 8. 01/27/2020 on evaluation today patient appears to be doing about the same in regard to his wound at this point. Fortunately there is no signs of active infection. Overall I feel like he is actually managing quite well. The wound is very macerated I think this is simply due to the fact that he has been using a Band-Aid as opposed to an absorptive bandage over top of the collagen. He really has nothing to drain into and then been putting over top of this Covan which is occluding it even more. 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 He wears an AFO brace on both feet. Noteworthy that he was  here in 2021 for an area on the left foot He tells me that he has had a wound on the plantar right heel for the better part of a year. Prior to this this may have opened and closed repetitively. He has not been specifically dressing this and he became alarmed when it would not close and also that there may have been an odor noticeable. He is here for a review of this. 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 ABI in our clinic was 1.22 on the right 10/30; x-ray of the right heel was normal no osseous destruction. His PCR culture that I did last week showed E. coli various anaerobes and MSSA. We have been using Hydrofera Blue and a heel offloading boot Electronic Signature(s) Signed: 06/24/2023 4:47:46 PM By: Baltazar Najjar MD Entered By: Baltazar Najjar on 06/24/2023 10:47:35 -------------------------------------------------------------------------------- Physical Exam Details Patient Name: Date of Service: [name] 06/24/2023 1:15 PM Medical Record Number: [id number] Patient Account Number: [id number] Date of Birth/Sex: Treating RN: 2005-09-10 (17 y.o. [name] Primary Care Provider: PA Fidela Juneau Other Clinician: [name] ([id number]) [id number] Page 2 of 6 Referring Provider: Treating Provider/Extender: RO BSO N, MICHA EL G GINGRICH, KRISTA Weeks in Treatment: 1 Constitutional Sitting or standing Blood Pressure is within target range for patient.. Pulse regular and within target range for patient.Marland Kitchen Respirations regular, non-labored and within target range.. Temperature is normal and within the target range for the patient.Marland Kitchen appears in no distress. Cardiovascular Pedal pulses  Palpable. Notes Wound exam; small punched-out area in the middle of his right heel. This has 2 to 3 mm of depth but no undermining. Thick layer of skin  around the edges of this. No purulence no periwound tenderness and no erythema. Electronic Signature(s) Signed: 06/24/2023 4:47:46 PM By: Baltazar Najjar MD Entered By: Baltazar Najjar on 06/24/2023 10:48:52 -------------------------------------------------------------------------------- Physician Orders Details Patient Name: Date of Service: [name] 06/24/2023 1:15 PM Medical Record Number: [id number] Patient Account Number: [id number] Date of Birth/Sex: Treating RN: 2006/07/11 (17 y.o. [name] Primary Care Provider: PA Zenovia Jordan, NO Other Clinician: Referring Provider: Treating Provider/Extender: RO BSO N, MICHA EL [place] Mon, KRISTA Weeks in Treatment: 1 The following information was scribed by: Midge Aver The information was scribed for: Maxwell Caul Verbal / Phone Orders: No Diagnosis Coding Follow-up Appointments Return Appointment in 1 week. Bathing/ Shower/ Hygiene Clean wound with Normal Saline or wound cleanser. Anesthetic (Use 'Patient Medications' Section for Anesthetic Order Entry) Lidocaine applied to wound bed Off-Loading Other: - Heel offloading shoe Wound Treatment Wound #2 - Calcaneus Wound Laterality: Right Cleanser: Soap and Water 3 x Per Week/30 Days Discharge Instructions: Gently cleanse wound with antibacterial soap, rinse and pat dry prior to dressing wounds Cleanser: Vashe 5.8 (oz) 3 x Per Week/30 Days Discharge Instructions: Use vashe 5.8 (oz) as directed Topical: Gentamicin 3 x Per Week/30 Days Discharge Instructions: Apply as directed by provider. Prim Dressing: Hydrofera Blue Ready Transfer Foam, 2.5x2.5 (in/in) 3 x Per Week/30 Days ary Discharge Instructions: Apply Hydrofera Blue Ready to wound bed as directed Secondary Dressing: ABD Pad 5x9 (in/in) 3 x Per Week/30 Days Discharge Instructions: Cover with ABD pad [name] ([id number]) [id number] Page 3 of 6 Secondary Dressing: Heel Cup 3 x  Per Week/30 Days Secured With: [employer] Sterile or Non-Sterile 6-ply 4.5x4 (yd/yd) 3 x Per Week/30 Days Discharge Instructions: Apply Kerlix as directed Patient Medications llergies: No Known Allergies A Notifications Medication Indication Start End wound infection 06/24/2023 Augmentin DOSE oral 500 mg-125 mg tablet - 1 tablet oral three times daily for 7 days gm-ve wound infection 06/24/2023 gentamicin DOSE topical 0.1 % cream - cream topical once daily with dressing changes Electronic Signature(s) Signed: 06/24/2023 1:57:19 PM By: Baltazar Najjar MD Entered By: Baltazar Najjar on 06/24/2023 10:57:19 -------------------------------------------------------------------------------- Problem List Details Patient Name: Date of Service: [name] 06/24/2023 1:15 PM Medical Record Number: [id number] Patient Account Number: [id number] Date of Birth/Sex: Treating RN: 03-26-2006 (17 y.o. 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, stage 3 06/17/2023 No Yes Q05.7 Lumbar spina bifida without hydrocephalus 06/17/2023 No Yes Inactive Problems Resolved Problems Electronic Signature(s) Signed: 06/24/2023 4:47:46 PM By: Baltazar Najjar MD Entered By: Baltazar Najjar on 06/24/2023 10:46:22 [name] ([id number]) [id number] Page 4 of 6 -------------------------------------------------------------------------------- Progress Note Details Patient Name: Date of Service: [name] 06/24/2023 1:15 PM Medical Record Number: [id number] Patient Account Number: [id number] Date of Birth/Sex: Treating RN: 03/10/2006 (17 y.o. [name] Primary Care Provider: PA Zenovia Jordan, NO Other Clinician: Referring  Provider: Treating Provider/Extender: RO BSO N, MICHA EL G GINGRICH, KRISTA Weeks in Treatment: 1 Subjective History of Present Illness (HPI) 01/20/2020 upon evaluation today patient presents for initial inspection here in our clinic concerning issues that  he has been having with his left foot laterally at the fifth metatarsal location. It appears that he has issues unfortunately with chronic rubbing on his brace. He does have spina bifida and he has issues with this fairly frequently. With that being said the good news is this does not appear to be infected. The bad news is it is a recurrent issue. He is can be having surgery for foot reconstruction June 8. 01/27/2020 on evaluation today patient appears to be doing about the same in regard to his wound at this point. Fortunately there is no signs of active infection. Overall I feel like he is actually managing quite well. The wound is very macerated I think this is simply due to the fact that he has been using a Band-Aid as opposed to an absorptive bandage over top of the collagen. He really has nothing to drain into and then been putting over top of this Covan which is occluding it even more. 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 He wears an AFO brace on both feet. Noteworthy that he was here in 2021 for an area on the left foot He tells me that he has had a wound on the plantar right heel for the better part of a year. Prior to this this may have opened and closed repetitively. He has not been specifically dressing this and he became alarmed when it would not close and also that there may have been an odor noticeable. He is here for a review of this. 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 ABI in our clinic was 1.22 on the right 10/30; x-ray of the right heel was normal no osseous destruction. His PCR culture that I did last week showed E. coli various anaerobes and MSSA. We have been using Hydrofera Blue and a heel offloading boot Objective Constitutional Sitting or standing Blood Pressure is within target range for patient.. Pulse regular and within target range for patient.Marland Kitchen Respirations regular, non-labored and within target range.. Temperature is normal and within the target range for the patient.Marland Kitchen appears in no distress. Vitals Time Taken: 1:19 PM, Height: 67 in, Weight: 130 lbs, BMI: 20.4, Temperature: 97.9 F, Pulse: 69 bpm, Respiratory Rate: 18 breaths/min, Blood Pressure: 109/64 mmHg. Cardiovascular Pedal pulses Palpable. General Notes: Wound exam; small punched-out area in the middle of his right heel. This has 2 to 3 mm of depth but no undermining. Thick layer of skin around the edges of this. No purulence no periwound tenderness and no erythema. Integumentary (Hair, Skin) Wound #2 status is Open. Original cause of wound was Pressure Injury. The date acquired was: 06/16/2022. The wound has been in treatment 1 weeks. The wound is located on the Right Calcaneus. The wound measures 0.8cm length x 1cm width x 0.4cm depth; 0.628cm^2 area and 0.251cm^3 volume. There is Fat Layer (Subcutaneous Tissue) exposed. There is a medium amount of sanguinous drainage noted. Foul odor after cleansing was noted. There is small (1-33%) red, pink granulation within the wound bed. [name] ([id number]) [id number] Page 5 of 6 Assessment Active Problems ICD-10 Pressure ulcer of right heel, stage 3 Lumbar spina bifida without  hydrocephalus Plan Follow-up Appointments: Return Appointment in 1 week. Bathing/ Shower/  Hygiene: Clean wound with Normal Saline or wound cleanser. Anesthetic (Use 'Patient Medications' Section for Anesthetic Order Entry): Lidocaine applied to wound bed Off-Loading: Other: - Heel offloading shoe WOUND #2: - Calcaneus Wound Laterality: Right Cleanser: Soap and Water 3 x Per Week/30 Days Discharge Instructions: Gently cleanse wound with antibacterial soap, rinse and pat dry prior to dressing wounds Cleanser: Vashe 5.8 (oz) 3 x Per Week/30 Days Discharge Instructions: Use vashe 5.8 (oz) as directed Topical: Gentamicin 3 x Per Week/30 Days Discharge Instructions: Apply as directed by provider. Prim Dressing: Hydrofera Blue Ready Transfer Foam, 2.5x2.5 (in/in) 3 x Per Week/30 Days ary Discharge Instructions: Apply Hydrofera Blue Ready to wound bed as directed Secondary Dressing: ABD Pad 5x9 (in/in) 3 x Per Week/30 Days Discharge Instructions: Cover with ABD pad Secondary Dressing: Heel Cup 3 x Per Week/30 Days Secured With: [employer] Sterile or Non-Sterile 6-ply 4.5x4 (yd/yd) 3 x Per Week/30 Days Discharge Instructions: Apply Kerlix as directed 1. I am going to prescribe Augmentin orally and topical gentamicin 2. 7 days of treatment we will see him back next week. He will need debridement of the raised edges and then my plan is to put him in a total contact cast. I have spoken to him about this in some detail 3. Fortunately he does not drive Electronic Signature(s) Signed: 06/24/2023 4:47:46 PM By: Baltazar Najjar MD Entered By: Baltazar Najjar on 06/24/2023 10:50:32 -------------------------------------------------------------------------------- SuperBill Details Patient Name: Date of Service: [name] 06/24/2023 Medical Record Number: [id number] Patient Account Number: [id number] Date of Birth/Sex: Treating RN: 2005/10/01 (17 y.o. 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

## 2023-06-25 NOTE — Progress Notes (Signed)
[name] ([id number]) [id number] Page 1 of 9 Visit Report for 06/24/2023 Arrival Information Details Patient Name: Date of Service: [name] 06/24/2023 1:15 PM Medical Record Number: [id number] Patient Account Number: [id number] Date of Birth/Sex: Treating RN: [birth year] (17 y.o. [name] Primary Care Kimaya Whitlatch: PA Zenovia Jordan, NO Other Clinician: Referring Khloei Spiker: Treating Leesa Leifheit/Extender: RO BSO N, MICHA EL G GINGRICH, KRISTA Weeks in Treatment: 1 Visit Information History Since Last Visit Added or deleted any medications: No Patient Arrived: Ambulatory Any Avery allergies or adverse reactions: No Arrival Time: 13:16 Has Dressing in Place as Prescribed: Yes Accompanied By: self Pain Present Now: No Transfer Assistance: None Patient Identification Verified: Yes Secondary Verification Process Completed: Yes Patient Requires Transmission-Based Precautions: No Patient Has Alerts: No Electronic Signature(s) Signed: 06/24/2023 5:05:03 PM By: Midge Aver MSN RN CNS WTA Entered By: Midge Aver on 06/24/2023 13:17:02 -------------------------------------------------------------------------------- Clinic Level of Care Assessment Details Patient Name: Date of Service: [name] 06/24/2023 1:15 PM Medical Record Number: [id number] Patient Account Number: [id number] Date of Birth/Sex: Treating RN: May 16, 2006 (17 y.o. [name] Primary Care Marvens Hollars: PA TIENT, NO Other Clinician: Referring Violanda Bobeck: Treating Akaila Rambo/Extender: RO BSO N, MICHA EL G GINGRICH, KRISTA Weeks in Treatment: 1 Clinic Level of Care Assessment Items TOOL 4 Quantity Score X- 1 0 Use when only an EandM is performed on FOLLOW-UP visit ASSESSMENTS - Nursing Assessment / Reassessment X- 1 10 Reassessment of Co-morbidities (includes updates in patient status) X- 1 5 Reassessment of Adherence to Treatment Plan ASSESSMENTS - Wound and Skin A ssessment /  Reassessment X - Simple Wound Assessment / Reassessment - one wound 1 5 []  - 0 Complex Wound Assessment / Reassessment - multiple wounds [name] ([id number]) [id number] Page 2 of 9 []  - 0 Dermatologic / Skin Assessment (not related to wound area) ASSESSMENTS - Focused Assessment []  - 0 Circumferential Edema Measurements - multi extremities []  - 0 Nutritional Assessment / Counseling / Intervention []  - 0 Lower Extremity Assessment (monofilament, tuning fork, pulses) []  - 0 Peripheral Arterial Disease Assessment (using hand held doppler) ASSESSMENTS - Ostomy and/or Continence Assessment and Care []  - 0 Incontinence Assessment and Management []  - 0 Ostomy Care Assessment and Management (repouching, etc.) PROCESS - Coordination of Care X - Simple Patient / Family Education for ongoing care 1 15 []  - 0 Complex (extensive) Patient / Family Education for ongoing care X- 1 10 Staff obtains Chiropractor, Records, T Results / Process Orders est []  - 0 Staff telephones HHA, Nursing Homes / Clarify orders / etc []  - 0 Routine Transfer to another Facility (non-emergent condition) []  - 0 Routine Hospital Admission (non-emergent condition) []  - 0 Avery Admissions / Manufacturing engineer / Ordering NPWT Apligraf, etc. , []  - 0 Emergency Hospital Admission (emergent condition) X- 1 10 Simple Discharge Coordination []  - 0 Complex (extensive) Discharge Coordination PROCESS - Special Needs []  - 0 Pediatric / Minor Patient Management []  - 0 Isolation Patient Management []  - 0 Hearing / Language / Visual special needs []  - 0 Assessment of Community assistance (transportation, D/C planning, etc.) []  - 0 Additional assistance / Altered mentation []  - 0 Support Surface(s) Assessment (bed, cushion, seat, etc.) INTERVENTIONS - Wound Cleansing / Measurement X - Simple Wound Cleansing - one wound 1 5 []  - 0 Complex Wound Cleansing - multiple wounds X- 1  5 Wound Imaging (photographs - any number of wounds) []  - 0 Wound Tracing (instead of photographs) X- 1 5 Simple  Wound Measurement - one wound []  - 0 Complex Wound Measurement - multiple wounds INTERVENTIONS - Wound Dressings X - Small Wound Dressing one or multiple wounds 1 10 []  - 0 Medium Wound Dressing one or multiple wounds []  - 0 Large Wound Dressing one or multiple wounds X- 1 5 Application of Medications - topical []  - 0 Application of Medications - injection INTERVENTIONS - Miscellaneous []  - 0 External ear exam []  - 0 Specimen Collection (cultures, biopsies, blood, body fluids, etc.) []  - 0 Specimen(s) / Culture(s) sent or taken to Lab for analysis DE, [facility] ([id number]) [id number] Page 3 of 9 []  - 0 Patient Transfer (multiple staff / Nurse, adult / Similar devices) []  - 0 Simple Staple / Suture removal (25 or less) []  - 0 Complex Staple / Suture removal (26 or more) []  - 0 Hypo / Hyperglycemic Management (close monitor of Blood Glucose) []  - 0 Ankle / Brachial Index (ABI) - do not check if billed separately X- 1 5 Vital Signs Has the patient been seen at the hospital within the last three years: Yes Total Score: 90 Level Of Care: Avery/Established - Level 3 Electronic Signature(s) Signed: 06/24/2023 5:05:03 PM By: Midge Aver MSN RN CNS WTA Entered By: Midge Aver on 06/24/2023 13:52:46 -------------------------------------------------------------------------------- Encounter Discharge Information Details Patient Name: Date of Service: [name] 06/24/2023 1:15 PM Medical Record Number: [id number] Patient Account Number: [id number] Date of Birth/Sex: Treating RN: 2006/08/19 (17 y.o. [name] Primary Care Narcissus Detwiler: PA Zenovia Jordan, NO Other Clinician: Referring Brittian Renaldo: Treating Aarionna Germer/Extender: RO BSO N, MICHA EL G GINGRICH, KRISTA Weeks in Treatment: 1 Encounter Discharge Information Items Discharge  Condition: Stable Ambulatory Status: Ambulatory Discharge Destination: Home Transportation: Private Auto Accompanied By: self Schedule Follow-up Appointment: Yes Clinical Summary of Care: Electronic Signature(s) Signed: 06/24/2023 5:05:03 PM By: Midge Aver MSN RN CNS WTA Entered By: Midge Aver on 06/24/2023 13:54:10 -------------------------------------------------------------------------------- Lower Extremity Assessment Details Patient Name: Date of Service: [name] 06/24/2023 1:15 PM Medical Record Number: [id number] Patient Account Number: [id number] Date of Birth/Sex: Treating RN: September 13, 2005 (17 y.o. [name] Primary Care Tanay Misuraca: PA Fidela Juneau Other Clinician: Referring Lemmie Steinhaus: Treating Carder Yin/Extender: [address] [place], Cleophas Dunker ([id number]) [id number] Page 4 of 9 Weeks in Treatment: 1 Psychologist, prison and probation services) Signed: 06/24/2023 5:05:03 PM By: Midge Aver MSN RN CNS WTA Entered By: Midge Aver on 06/24/2023 13:25:17 -------------------------------------------------------------------------------- Multi Wound Chart Details Patient Name: Date of Service: [name] 06/24/2023 1:15 PM Medical Record Number: [id number] Patient Account Number: [id number] Date of Birth/Sex: Treating RN: 03/08/2006 (17 y.o. [name] Primary Care Khaliel Morey: PA TIENT, NO Other Clinician: Referring Rishab Stoudt: Treating Annajulia Lewing/Extender: RO BSO N, MICHA EL G GINGRICH, KRISTA Weeks in Treatment: 1 Vital Signs Height(in): 67 Pulse(bpm): 69 Weight(lbs): 130 Blood Pressure(mmHg): 109/64 Body Mass Index(BMI): 20.4 Temperature(F): 97.9 Respiratory Rate(breaths/min): 18 [2:Photos:] [N/A:N/A] Right Calcaneus N/A N/A Wound Location: Pressure Injury N/A N/A Wounding Event: Pressure Ulcer N/A N/A Primary Etiology: 06/16/2022 N/A N/A Date Acquired: 1 N/A N/A Weeks of Treatment: Open N/A N/A Wound  Status: No N/A N/A Wound Recurrence: 0.8x1x0.4 N/A N/A Measurements L x W x D (cm) 0.628 N/A N/A A (cm) : rea 0.251 N/A N/A Volume (cm) : 23.90% N/A N/A % Reduction in A rea: 49.30% N/A N/A % Reduction in Volume: Category/Stage III N/A N/A Classification: Medium N/A N/A Exudate A mount: Sanguinous N/A N/A Exudate Type: red N/A N/A Exudate Color:  Yes N/A N/A Foul Odor A Cleansing: fter No N/A N/A Odor A nticipated Due to Product Use: Small (1-33%) N/A N/A Granulation A mount: Red, Pink N/A N/A Granulation Quality: Fat Layer (Subcutaneous Tissue): Yes N/A N/A Exposed Structures: Fascia: No Tendon: No Muscle: No Joint: No Bone: No None N/A N/A EpithelializationEula Avery ([id number]) [id number] Page 5 of 9 Treatment Notes Electronic Signature(s) Signed: 06/24/2023 5:05:03 PM By: Midge Aver MSN RN CNS WTA Entered By: Midge Aver on 06/24/2023 13:32:50 -------------------------------------------------------------------------------- Multi-Disciplinary Care Plan Details Patient Name: Date of Service: [name] 06/24/2023 1:15 PM Medical Record Number: [id number] Patient Account Number: [id number] Date of Birth/Sex: Treating RN: 2005/10/11 (17 y.o. [name] Primary Care Mirabel Ahlgren: PA TIENT, NO Other Clinician: Referring Narelle Schoening: Treating Latysha Thackston/Extender: RO BSO N, MICHA EL G GINGRICH, KRISTA Weeks in Treatment: 1 Active Inactive Necrotic Tissue Nursing Diagnoses: Impaired tissue integrity related to necrotic/devitalized tissue Knowledge deficit related to management of necrotic/devitalized tissue Goals: Necrotic/devitalized tissue will be minimized in the wound bed Date Initiated: 06/18/2023 Target Resolution Date: 07/18/2023 Goal Status: Active Patient/caregiver will verbalize understanding of reason and process for debridement of necrotic tissue Date Initiated: 06/18/2023 Target Resolution Date:  07/18/2023 Goal Status: Active Interventions: Assess patient pain level pre-, during and post procedure and prior to discharge Provide education on necrotic tissue and debridement process Treatment Activities: Excisional debridement : 06/17/2023 Notes: Pressure Nursing Diagnoses: Knowledge deficit related to causes and risk factors for pressure ulcer development Knowledge deficit related to management of pressures ulcers Potential for impaired tissue integrity related to pressure, friction, moisture, and shear Goals: Patient will remain free from development of additional pressure ulcers Date Initiated: 06/18/2023 Target Resolution Date: 07/18/2023 Goal Status: Active Patient will remain free of pressure ulcers Date Initiated: 06/18/2023 Target Resolution Date: 07/18/2023 Goal Status: Active Patient/caregiver will verbalize risk factors for pressure ulcer development Date Initiated: 06/18/2023 Target Resolution Date: 07/11/2023 Goal Status: Active Patient/caregiver will verbalize understanding of pressure ulcer management Date Initiated: 06/18/2023 Target Resolution Date: 06/17/2023 MAXEN, [place] ([id number]) [id number] Page 6 of 9 Goal Status: Active Interventions: Assess offloading mechanisms upon admission and as needed Assess potential for pressure ulcer upon admission and as needed Provide education on pressure ulcers Notes: Wound/Skin Impairment Nursing Diagnoses: Impaired tissue integrity Knowledge deficit related to ulceration/compromised skin integrity Goals: Patient/caregiver will verbalize understanding of skin care regimen Date Initiated: 06/18/2023 Target Resolution Date: 07/18/2023 Goal Status: Active Ulcer/skin breakdown will have a volume reduction of 30% by week 4 Date Initiated: 06/18/2023 Target Resolution Date: 07/18/2023 Goal Status: Active Ulcer/skin breakdown will have a volume reduction of 50% by week 8 Date  Initiated: 06/18/2023 Target Resolution Date: 08/17/2023 Goal Status: Active Ulcer/skin breakdown will have a volume reduction of 80% by week 12 Date Initiated: 06/18/2023 Target Resolution Date: 09/17/2023 Goal Status: Active Ulcer/skin breakdown will heal within 14 weeks Date Initiated: 06/18/2023 Target Resolution Date: 10/01/2023 Goal Status: Active Interventions: Assess patient/caregiver ability to obtain necessary supplies Assess patient/caregiver ability to perform ulcer/skin care regimen upon admission and as needed Assess ulceration(s) every visit Provide education on ulcer and skin care Treatment Activities: Skin care regimen initiated : 06/17/2023 Notes: Electronic Signature(s) Signed: 06/24/2023 5:05:03 PM By: Midge Aver MSN RN CNS WTA Entered By: Midge Aver on 06/24/2023 13:53:15 -------------------------------------------------------------------------------- Pain Assessment Details Patient Name: Date of Service: [name] 06/24/2023 1:15 PM Medical Record Number: [id number] Patient Account Number: [id number] Date of Birth/Sex: Treating RN: 2006/06/06 (17 y.o. [name] Primary Care  Lusine Corlett: PA Zenovia Jordan, NO Other Clinician: Referring Hyde Sires: Treating Ladell Bey/Extender: RO BSO N, MICHA EL G GINGRICH, KRISTA Weeks in Treatment: 1 Active Problems Location of Pain Severity and Description of Pain Patient Has Paino No Site Locations [place], Cleophas Dunker ([id number]) [id number] Page 7 of 9 Pain Management and Medication Current Pain Management: Electronic Signature(s) Signed: 06/24/2023 5:05:03 PM By: Midge Aver MSN RN CNS WTA Entered By: Midge Aver on 06/24/2023 13:19:33 -------------------------------------------------------------------------------- Patient/Caregiver Education Details Patient Name: Date of Service: [name] 10/30/2024andnbsp1:15 PM Medical Record Number: [id number] Patient Account Number:  [id number] Date of Birth/Gender: Treating RN: 22-May-2006 (17 y.o. [name] Primary Care Physician: PA Zenovia Jordan, NO Other Clinician: Referring Physician: Treating Physician/Extender: RO BSO N, MICHA EL [place] Mon, KRISTA Weeks in Treatment: 1 Education Assessment Education Provided To: Patient Education Topics Provided Wound/Skin Impairment: Handouts: Caring for Your Ulcer Methods: Explain/Verbal Responses: State content correctly Electronic Signature(s) Signed: 06/24/2023 5:05:03 PM By: Midge Aver MSN RN CNS WTA Entered By: Midge Aver on 06/24/2023 13:53:28 [name] ([id number]) [id number] Page 8 of 9 -------------------------------------------------------------------------------- Wound Assessment Details Patient Name: Date of Service: [name] 06/24/2023 1:15 PM Medical Record Number: [id number] Patient Account Number: [id number] Date of Birth/Sex: Treating RN: [birth year] (17 y.o. [name] Primary Care Hurshel Bouillon: PA TIENT, NO Other Clinician: Referring Amron Guerrette: Treating Issachar Broady/Extender: RO BSO N, MICHA EL G GINGRICH, KRISTA Weeks in Treatment: 1 Wound Status Wound Number: 2 Primary Etiology: Pressure Ulcer Wound Location: Right Calcaneus Wound Status: Open Wounding Event: Pressure Injury Date Acquired: 06/16/2022 Weeks Of Treatment: 1 Clustered Wound: No Photos Wound Measurements Length: (cm) 0.8 Width: (cm) 1 Depth: (cm) 0.4 Area: (cm) 0.628 Volume: (cm) 0.251 % Reduction in Area: 23.9% % Reduction in Volume: 49.3% Epithelialization: None Wound Description Classification: Category/Stage III Exudate Amount: Medium Exudate Type: Sanguinous Exudate Color: red Foul Odor After Cleansing: Yes Due to Product Use: No Wound Bed Granulation Amount: Small (1-33%) Exposed Structure Granulation Quality: Red, Pink Fascia Exposed: No Fat Layer (Subcutaneous Tissue) Exposed: Yes Tendon Exposed: No Muscle  Exposed: No Joint Exposed: No Bone Exposed: No Treatment Notes Wound #2 (Calcaneus) Wound Laterality: Right Cleanser Soap and Water Discharge Instruction: Gently cleanse wound with antibacterial soap, rinse and pat dry prior to dressing wounds Vashe 5.8 (oz) [name] ([id number]) [id number] Page 9 of 9 Discharge Instruction: Use vashe 5.8 (oz) as directed Peri-Wound Care Topical Gentamicin Discharge Instruction: Apply as directed by Ladajah Soltys. Primary Dressing Hydrofera Blue Ready Transfer Foam, 2.5x2.5 (in/in) Discharge Instruction: Apply Hydrofera Blue Ready to wound bed as directed Secondary Dressing ABD Pad 5x9 (in/in) Discharge Instruction: Cover with ABD pad Heel Cup Secured With [employer] Sterile or Non-Sterile 6-ply 4.5x4 (yd/yd) Discharge Instruction: Apply Kerlix as directed Compression Wrap Compression Stockings Add-Ons Electronic Signature(s) Signed: 06/24/2023 5:05:03 PM By: Midge Aver MSN RN CNS WTA Entered By: Midge Aver on 06/24/2023 13:25:02 -------------------------------------------------------------------------------- Vitals Details Patient Name: Date of Service: [name] 06/24/2023 1:15 PM Medical Record Number: [id number] Patient Account Number: [id number] Date of Birth/Sex: Treating RN: [birth year] (17 y.o. [name] Primary Care Jayel Inks: PA TIENT, NO Other Clinician: Referring Jurell Basista: Treating Lexxus Underhill/Extender: RO BSO N, MICHA EL G GINGRICH, KRISTA Weeks in Treatment: 1 Vital Signs Time Taken: 13:19 Temperature (F): 97.9 Height (in): 67 Pulse (bpm): 69 Weight (lbs): 130 Respiratory Rate (breaths/min): 18 Body Mass Index (BMI): 20.4 Blood Pressure (mmHg): 109/64 Reference Range: 80 - 120 mg / dl Electronic Signature(s) Signed: 06/24/2023 5:05:03 PM  By: Midge Aver MSN RN CNS WTA Entered By: Midge Aver on 06/24/2023 13:19:26

## 2023-07-01 ENCOUNTER — Encounter: Payer: Medicaid Other | Attending: Physician Assistant | Admitting: Physician Assistant

## 2023-07-01 DIAGNOSIS — L89613 Pressure ulcer of right heel, stage 3: Secondary | ICD-10-CM | POA: Diagnosis present

## 2023-07-01 DIAGNOSIS — Q0701 Arnold-Chiari syndrome with spina bifida: Secondary | ICD-10-CM | POA: Insufficient documentation

## 2023-07-01 NOTE — Progress Notes (Addendum)
[name] ([id number]) [id number] Page 1 of 8 Visit Report for 07/01/2023 Chief Complaint Document Details Patient Name: Date of Service: [name] 07/01/2023 1:00 PM Medical Record Number: [id number] Patient Account Number: [id number] Date of Birth/Sex: Treating RN: 08/08/2006 (17 y.o. [name]) Yevonne Pax Primary Care Provider: PA Zenovia Jordan, NO Other Clinician: Betha Loa Referring Provider: Treating Provider/Extender: Elizebeth Brooking, KRISTA Weeks in Treatment: 2 Information Obtained from: Patient Chief Complaint Pressure ulcer left foot due to brace 06/17/2023; patient is here for review of a pressure area on the right plantar heel Electronic Signature(s) Signed: 07/01/2023 1:19:53 PM By: Allen Derry PA-C Entered By: Allen Derry on 07/01/2023 13:19:53 -------------------------------------------------------------------------------- Debridement Details Patient Name: Date of Service: [name] 07/01/2023 1:00 PM Medical Record Number: [id number] Patient Account Number: [id number] Date of Birth/Sex: Treating RN: July 21, 2006 (17 y.o. [name] Primary Care Provider: PA Zenovia Jordan, NO Other Clinician: Betha Loa Referring Provider: Treating Provider/Extender: Elizebeth Brooking, KRISTA Weeks in Treatment: 2 Debridement Performed for Assessment: Wound #2 Right Calcaneus Performed By: Physician Allen Derry, PA-C The following information was scribed by: Betha Loa The information was scribed for: Allen Derry Debridement Type: Debridement Level of Consciousness (Pre-procedure): Awake and Alert Pre-procedure Verification/Time Out Yes - 13:33 Taken: Start Time: 13:33 Percent of Wound Bed Debrided: 100% T Area Debrided (cm): otal 0.22 Tissue and other material debrided: Viable, Non-Viable, Callus, Slough, Subcutaneous, Slough Level: Skin/Subcutaneous Tissue Debridement Description: Excisional Instrument:  Curette Bleeding: Minimum Hemostasis Achieved: Pressure Response to Treatment: Procedure was tolerated well [name] ([id number]) [id number] Page 2 of 8 Level of Consciousness (Post- Awake and Alert procedure): Post Debridement Measurements of Total Wound Length: (cm) 0.4 Stage: Category/Stage III Width: (cm) 0.7 Depth: (cm) 0.5 Volume: (cm) 0.11 Character of Wound/Ulcer Post Debridement: Stable Post Procedure Diagnosis Same as Pre-procedure Electronic Signature(s) Signed: 07/01/2023 5:39:21 PM By: Betha Loa Signed: 07/02/2023 11:25:01 AM By: Allen Derry PA-C Signed: 07/08/2023 4:43:41 PM By: Yevonne Pax RN Entered By: Betha Loa on 07/01/2023 13:40:11 -------------------------------------------------------------------------------- HPI Details Patient Name: Date of Service: [name] 07/01/2023 1:00 PM Medical Record Number: [id number] Patient Account Number: [id number] Date of Birth/Sex: Treating RN: [birth year] (17 y.o. [name] Primary Care Provider: PA Zenovia Jordan, NO Other Clinician: Betha Loa Referring Provider: Treating Provider/Extender: Elizebeth Brooking, KRISTA Weeks in Treatment: 2 History of Present Illness HPI Description: 01/20/2020 upon evaluation today patient presents for initial inspection here in our clinic concerning issues that he has been having with his left foot laterally at the fifth metatarsal location. It appears that he has issues unfortunately with chronic rubbing on his brace. He does have spina bifida and he has issues with this fairly frequently. With that being said the good news is this does not appear to be infected. The bad news is it is a recurrent issue. He is can be having surgery for foot reconstruction June 8. 01/27/2020 on evaluation today patient appears to be doing about the same in regard to his wound at this point. Fortunately there is no signs of active infection. Overall I  feel like he is actually managing quite well. The wound is very macerated I think this is simply due to the fact that he has been using a Band-Aid as opposed to an absorptive bandage over top of the collagen. He really has nothing to drain into and then been putting over top of this Covan which is occluding it even more. 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 He wears an AFO brace on both feet. Noteworthy that he was here in 2021 for an area on the left foot He tells me that he has had a wound on the plantar right heel for the better part of a year. Prior to this this may have opened and closed repetitively. He has not been specifically dressing this and he became alarmed when it would not close and also that there may have been an odor noticeable. He is here for a review of this. 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 ABI in our clinic was 1.22 on the right 10/30; x-ray of the right heel was normal no osseous destruction. His PCR culture that I did last week showed E. coli various anaerobes and MSSA. We have been using Hydrofera Blue and a heel offloading boot 07-01-2023 upon evaluation today patient appears to be doing decently well currently in regard to his wound. Fortunately there does not appear to be any signs of active infection at this time. This is a gentleman whom I have seen previously about 3 years ago. He actually is doing decently well at this point he did have some debridement last week with Dr. Leanord Hawking he said to have a cast today he will be come back on Friday to change this out. Electronic  Signature(s) Signed: 07/01/2023 1:49:10 PM By: Azzie Glatter, Cleophas Dunker ([id number]) PM By: Allen Derry PA-C [phone] Page 3 of 8 Signed: 07/01/2023 1:49:10 Entered By: Allen Derry on 07/01/2023 13:49:09 -------------------------------------------------------------------------------- Physical Exam Details Patient Name: Date of Service: [name] 07/01/2023 1:00 PM Medical Record Number: [id number] Patient Account Number: [id number] Date of Birth/Sex: Treating RN: [birth year] (17 y.o. [name] Primary Care Provider: PA Zenovia Jordan, NO Other Clinician: Betha Loa Referring Provider: Treating Provider/Extender: Elizebeth Brooking, KRISTA Weeks in Treatment: 2 Constitutional Well-nourished and well-hydrated in no acute distress. Respiratory normal breathing without difficulty. Psychiatric this patient is able to make decisions and demonstrates good insight into disease process. Alert and Oriented x 3. pleasant and cooperative. Notes Upon inspection patient's wound bed actually showed signs of good granulation and epithelization at this point. Fortunately I do not see any signs of active infection locally or systemically which is great news and in general I do believe that we are making really good headway here towards closure. I am extremely pleased with how things stand currently and I think the cast is can help to push this over the edge and get this closed Electronic Signature(s) Signed: 07/01/2023 1:49:33 PM By: Allen Derry PA-C Entered By: Allen Derry on 07/01/2023 13:49:32 -------------------------------------------------------------------------------- Physician Orders Details Patient Name: Date of Service: [name] 07/01/2023 1:00 PM Medical Record Number: [id number] Patient Account Number: [id number] Date of Birth/Sex: Treating RN: 2006/08/08 (17 y.o. 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 Bathing/ Shower/ Hygiene Clean wound with Normal Saline or wound cleanser. Anesthetic (Use 'Patient Medications' Section for Anesthetic Order Entry) Lidocaine applied to wound bed Off-Loading Total Contact Cast to Right Lower Extremity Wound Treatment Wound #2 - Calcaneus Wound Laterality: Right Cleanser: Soap and Water 3 x Per Week/30 Days Discharge Instructions: Gently cleanse wound with antibacterial soap, rinse and pat dry prior to dressing wounds Cleanser: Vashe 5.8 (oz) 3 x Per Week/30 Days Discharge Instructions: Use vashe 5.8 (oz) as directed Topical: Gentamicin 3 x Per Week/30 Days Discharge Instructions: Apply as directed by provider. Prim Dressing: Hydrofera Blue Ready Transfer Foam, 2.5x2.5 (in/in) 3 x Per Week/30 Days ary Discharge Instructions: Apply Hydrofera Blue Ready to wound bed as directed Secondary Dressing: ABD Pad 5x9 (in/in) 3 x Per Week/30 Days Discharge Instructions: Cover with ABD pad Secondary Dressing: Heel Cup 3 x Per Week/30 Days Secured With: [employer] Sterile or Non-Sterile 6-ply 4.5x4 (yd/yd) 3 x Per Week/30 Days Discharge Instructions: Apply Kerlix as directed Electronic Signature(s) Signed: 07/01/2023 5:39:21 PM By: Betha Loa Signed: 07/02/2023 11:25:01 AM By: Allen Derry PA-C Entered By: Betha Loa on 07/01/2023 13:40:31 -------------------------------------------------------------------------------- Problem List  Details Patient Name: Date of Service: [name] 07/01/2023 1:00 PM Medical Record Number: [id number] Patient Account Number: [id number] Date of Birth/Sex: Treating RN: [birth year] (17 y.o. 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, stage 3 06/17/2023 No Yes Q05.7 Lumbar spina bifida without hydrocephalus 06/17/2023 No Yes [name] ([id number]) [id number] Page 5 of 8 Inactive Problems Resolved Problems Electronic Signature(s) Signed: 07/01/2023 1:19:48 PM By: Allen Derry PA-C Entered By: Allen Derry on 07/01/2023 13:19:48 -------------------------------------------------------------------------------- Progress Note Details Patient Name: Date of Service: [name] 07/01/2023 1:00 PM Medical Record Number: [id number] Patient Account Number: [id number] Date of Birth/Sex: Treating RN: 09-10-2005 (17 y.o. 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 It appears that he has issues unfortunately with chronic rubbing on his brace. He does have spina bifida and he has issues with this fairly frequently. With that being said the good news is this  does not appear to be infected. The bad news is it is a recurrent issue. He is can be having surgery for foot reconstruction June 8. 01/27/2020 on evaluation today patient appears to be doing about the same in regard to his wound at this point. Fortunately there is no signs of active infection. Overall I feel like he is actually managing quite well. The wound is very macerated I think this is simply due to the fact that he has been using a Band-Aid as opposed to an absorptive bandage over top of the collagen. He really has nothing to drain into  and then been putting over top of this Covan which is occluding it even more. 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 He wears an AFO brace on both feet. Noteworthy that he was here in 2021 for an area on the left foot He tells me that he has had a wound on the plantar right heel for the better part of a year. Prior to this this may have opened and closed repetitively. He has not been specifically dressing this and he became alarmed when it would not close and also that there may have been an odor noticeable. He is here for a review of this. 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 ABI in our clinic was 1.22 on the right 10/30; x-ray of the right heel was normal no osseous destruction. His PCR culture that I did last week showed E. coli various anaerobes and MSSA. We have been using Hydrofera Blue and a heel offloading boot 07-01-2023 upon evaluation today patient appears to be doing decently well currently in regard to his wound. Fortunately there does not appear to be any signs of  active infection at this time. This is a gentleman whom I have seen previously about 3 years ago. He actually is doing decently well at this point he did have some debridement last week with Dr. Leanord Hawking he said to have a cast today he will be come back on Friday to change this out. [name] ([id number]) [id number] Page 6 of 8 Objective Constitutional Well-nourished and well-hydrated in no acute distress. Vitals Time Taken: 1:11 PM, Height: 67 in, Weight: 130 lbs, BMI: 20.4, Temperature: 98.4 F, Pulse: 59 bpm, Respiratory Rate: 16 breaths/min, Blood Pressure: 113/70 mmHg. Respiratory normal breathing without difficulty. Psychiatric this patient is able to make decisions and demonstrates good insight into disease process. Alert and Oriented x 3. pleasant and cooperative. General Notes: Upon inspection patient's wound bed actually showed signs of good granulation and epithelization at this point. Fortunately I do not see any signs of active infection locally or systemically which is great news and in general I do believe that we are making really good headway here towards closure. I am extremely pleased with how things stand currently and I think the cast is can help to push this over the edge and get this closed Integumentary (Hair, Skin) Wound #2 status is Open. Original cause of wound was Pressure Injury. The date acquired was: 06/16/2022. The wound has been in treatment 2 weeks. The wound is located on the Right Calcaneus. The wound measures 0.4cm length x 0.7cm width x 0.5cm depth; 0.22cm^2 area and 0.11cm^3 volume. There is Fat Layer (Subcutaneous Tissue) exposed. There is a medium amount of sanguinous drainage noted. There is small (1-33%) red, pink granulation within the wound bed. There is a medium (34-66%) amount of necrotic tissue within the wound bed including Adherent Slough. Assessment Active Problems ICD-10 Pressure ulcer of right heel, stage  3 Lumbar spina bifida without hydrocephalus Procedures Wound #2 Pre-procedure diagnosis of Wound #2 is a Pressure Ulcer located on the Right Calcaneus . There was a Excisional Skin/Subcutaneous Tissue Debridement with a total area of 0.22 sq  cm performed by Allen Derry, PA-C. With the following instrument(s): Curette to remove Viable and Non-Viable tissue/material. Material removed includes Callus, Subcutaneous Tissue, and Slough. A time out was conducted at 13:33, prior to the start of the procedure. A Minimum amount of bleeding was controlled with Pressure. The procedure was tolerated well. Post Debridement Measurements: 0.4cm length x 0.7cm width x 0.5cm depth; 0.11cm^3 volume. Post debridement Stage noted as Category/Stage III. Character of Wound/Ulcer Post Debridement is stable. Post procedure Diagnosis Wound #2: Same as Pre-Procedure Pre-procedure diagnosis of Wound #2 is a Pressure Ulcer located on the Right Calcaneus . There was a T Research scientist (life sciences) Procedure by Allen Derry, PA-C. otal Post procedure Diagnosis Wound #2: Same as Pre-Procedure Plan Follow-up Appointments: Return Appointment in 1 week. Bathing/ Shower/ Hygiene: Clean wound with Normal Saline or wound cleanser. Anesthetic (Use 'Patient Medications' Section for Anesthetic Order Entry): Lidocaine applied to wound bed Off-Loading: T Contact Cast to Right Lower Extremity otal WOUND #2: - Calcaneus Wound Laterality: Right Cleanser: Soap and Water 3 x Per Week/30 Days Discharge Instructions: Gently cleanse wound with antibacterial soap, rinse and pat dry prior to dressing wounds Cleanser: Vashe 5.8 (oz) 3 x Per Week/30 Days Discharge Instructions: Use vashe 5.8 (oz) as directed Topical: Gentamicin 3 x Per Week/30 Days Discharge Instructions: Apply as directed by provider. Prim Dressing: Hydrofera Blue Ready Transfer Foam, 2.5x2.5 (in/in) 3 x Per Week/30 Days ary Discharge Instructions: Apply Hydrofera Blue Ready to wound  bed as directed Secondary Dressing: ABD Pad 5x9 (in/in) 3 x Per Week/30 Days Discharge Instructions: Cover with ABD pad Secondary Dressing: Heel Cup 3 x Per Week/30 Days Secured With: [employer] Sterile or Non-Sterile 6-ply 4.5x4 (yd/yd) 3 x Per Week/30 Days Discharge Instructions: Apply Kerlix as directed [name] ([id number]) [id number] Page 7 of 8 1. I would recommend that we continue with the [facility]. I think this is really doing a good job here. 2. I am going to recommend as well that the patient should continue to monitor for any signs of infection or worsening. Based on what I am seeing I do believe that the total contact cast is the way to go we did go ahead and apply that today. 3. I am also going to recommend that he should continue to monitor for any signs of infection and if anything changes he knows contact the office and let me know. We will see patient back for reevaluation in 1 week here in the clinic. If anything worsens or changes patient will contact our office for additional recommendations. Electronic Signature(s) Signed: 07/01/2023 1:50:22 PM By: Allen Derry PA-C Entered By: Allen Derry on 07/01/2023 13:50:21 -------------------------------------------------------------------------------- Total Contact Cast Details Patient Name: Date of Service: [name] 07/01/2023 1:00 PM Medical Record Number: [id number] Patient Account Number: [id number] Date of Birth/Sex: Treating RN: 18-Nov-2005 (17 y.o. [name]) Yevonne Pax Primary Care Provider: PA Zenovia Jordan, NO Other Clinician: Betha Loa Referring Provider: Treating Provider/Extender: Elizebeth Brooking, KRISTA Weeks in Treatment: 2 T Contact Cast Applied for Wound Assessment: otal Wound #2 Right Calcaneus Performed By: Physician Allen Derry, PA-C The following information was scribed by: Betha Loa The information was scribed for: Allen Derry Post Procedure  Diagnosis Same as Pre-procedure Electronic Signature(s) Signed: 07/01/2023 5:39:21 PM By: Betha Loa Signed: 07/02/2023 11:25:01 AM By: Allen Derry PA-C Entered By: Betha Loa on 07/01/2023 13:39:58 -------------------------------------------------------------------------------- SuperBill Details Patient Name: Date of Service: [name] 07/01/2023 Medical Record Number: [id number] Patient Account  Number: [id number] Date of Birth/Sex: Treating RN: May 29, 2006 (17 y.o. [name]) Yevonne Pax Primary Care Provider: PA Zenovia Jordan, [place] Other Clinician: Betha Loa Referring Provider: Treating Provider/Extender: Elizebeth Brooking, KRISTA Weeks in Treatment: 2 Diagnosis Coding [name] ([id number]) [id number] Page 8 of 8 ICD-10 Codes Code Description [phone] Pressure ulcer of right heel, stage 3 Q05.7 Lumbar spina bifida without hydrocephalus Facility Procedures : CPT4 Code: [id number] Description: 11042 - DEB SUBQ TISSUE 20 SQ CM/< ICD-10 Diagnosis Description L89.613 Pressure ulcer of right heel, stage 3 Modifier: Quantity: 1 Physician Procedures : CPT4 Code Description Modifier [id number] 11042 - WC PHYS SUBQ TISS 20 SQ CM ICD-10 Diagnosis Description L89.613 Pressure ulcer of right heel, stage 3 Quantity: 1 Electronic Signature(s) Signed: 07/01/2023 1:53:16 PM By: Allen Derry PA-C Entered By: Allen Derry on 07/01/2023 13:53:16

## 2023-07-03 ENCOUNTER — Encounter: Payer: Medicaid Other | Admitting: Physician Assistant

## 2023-07-03 DIAGNOSIS — L89613 Pressure ulcer of right heel, stage 3: Secondary | ICD-10-CM | POA: Diagnosis not present

## 2023-07-03 NOTE — Progress Notes (Signed)
[name] ([id number]) [id number] Page 1 of 7 Visit Report for 07/03/2023 Chief Complaint Document Details Patient Name: Date of Service: [name] 07/03/2023 10:30 A M Medical Record Number: [id number] Patient Account Number: [id number] Date of Birth/Sex: Treating RN: 2005/11/18 (17 y.o. [name] Primary Care Provider: PA Zenovia Jordan, NO Other Clinician: Referring Provider: Treating Provider/Extender: Elizebeth Brooking, KRISTA Weeks in Treatment: 2 Information Obtained from: Patient Chief Complaint Pressure ulcer left foot due to brace 06/17/2023; patient is here for review of a pressure area on the right plantar heel Electronic Signature(s) Signed: 07/03/2023 10:43:56 AM By: Allen Derry PA-C Entered By: Allen Derry on 07/03/2023 07:43:56 -------------------------------------------------------------------------------- HPI Details Patient Name: Date of Service: [name] 07/03/2023 10:30 A M Medical Record Number: [id number] Patient Account Number: [id number] Date of Birth/Sex: Treating RN: 04-05-2006 (17 y.o. [name] Primary Care Provider: PA Zenovia Jordan, NO Other Clinician: Referring Provider: Treating Provider/Extender: Elizebeth Brooking, KRISTA Weeks in Treatment: 2 History of Present Illness HPI Description: 01/20/2020 upon evaluation today patient presents for initial inspection here in our clinic concerning issues that he has been having with his left foot laterally at the fifth metatarsal location. It appears that he has issues unfortunately with chronic rubbing on his brace. He does have spina bifida and he has issues with this fairly frequently. With that being said the good news is this does not appear to be infected. The bad news is it is a recurrent issue. He is can be having surgery for foot reconstruction June 8. 01/27/2020 on evaluation today patient appears to be doing about the same in regard to his wound at  this point. Fortunately there is no signs of active infection. Overall I feel like he is actually managing quite well. The wound is very macerated I think this is simply due to the fact that he has been using a Band-Aid as opposed to an absorptive bandage over top of the collagen. He really has nothing to drain into and then been putting over top of this Covan which is occluding it even more. 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 He wears an AFO brace on both feet. Noteworthy that he was here in 2021 for an area on the left foot SPIROS, RUTIGLIANO ([id number]) [id number] Page 2 of 7 He tells me that he has had a wound on the plantar right heel for the better part of a year. Prior to this this may have opened and closed repetitively. He has not been specifically dressing this and he became alarmed when it would not close and also that there may have been an odor noticeable. He is here for a review of this. 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 ABI in our clinic was 1.22 on the right 10/30; x-ray of the right heel was normal no osseous destruction. His PCR culture that I did last week showed E. coli various anaerobes and MSSA. We have been using Hydrofera Blue and a heel offloading boot 07-01-2023 upon evaluation today patient appears to be doing decently well currently in regard to his wound. Fortunately there does not appear  to be any signs of active infection at this time. This is a gentleman whom I have seen previously about 3 years ago. He actually is doing decently well at this point he  did have some debridement last week with Dr. Leanord Hawking he said to have a cast today he will be come back on Friday to change this out. 07-03-2023 upon evaluation today patient appears to be doing well currently in regard to his wound. He has been tolerating the dressing changes without complication. Fortunately there does not appear to be any signs of infection which is good news I think the cast that a great job. He is here for the first cast change to ensure nothing was rubbing the medial part of his ankle was the only place in question he thinks a bigger boot could potentially benefit him there. Electronic Signature(s) Signed: 07/03/2023 12:05:22 PM By: Allen Derry PA-C Entered By: Allen Derry on 07/03/2023 09:05:22 -------------------------------------------------------------------------------- Physical Exam Details Patient Name: Date of Service: [name] 07/03/2023 10:30 A M Medical Record Number: [id number] Patient Account Number: [id number] Date of Birth/Sex: Treating RN: 26-Jun-2006 (17 y.o. [name] Primary Care Provider: PA Zenovia Jordan, NO Other Clinician: Referring Provider: Treating Provider/Extender: Elizebeth Brooking, KRISTA Weeks in Treatment: 2 Constitutional Well-nourished and well-hydrated in no acute distress. Respiratory normal breathing without difficulty. Psychiatric this patient is able to make decisions and demonstrates good insight into disease process. Alert and Oriented x 3. pleasant and cooperative. Notes Upon inspection patient's wound actually looks well no debridement needed today and he seems to be doing quite well in fact with regard to the overall appearance I am hopeful that he will continue to show signs of good improvement as we move forward in this process. Hopefully this will not take too long. I did go ahead and reapply the total contact cast today he tolerated that application without complication. Electronic Signature(s) Signed: 07/03/2023  12:05:33 PM By: Allen Derry PA-C Entered By: Allen Derry on 07/03/2023 09:05:33 [name] ([id number]) [id number] Page 3 of 7 -------------------------------------------------------------------------------- Physician Orders Details Patient Name: Date of Service: [name] 07/03/2023 10:30 A M Medical Record Number: [id number] Patient Account Number: [id number] Date of Birth/Sex: Treating RN: [birth year] (17 y.o. 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 Bathing/ Shower/ Hygiene Clean wound with Normal Saline or wound cleanser. Anesthetic (Use 'Patient Medications' Section for Anesthetic Order Entry) Lidocaine applied to wound bed Off-Loading Total Contact Cast to Right Lower Extremity Wound Treatment Wound #2 - Calcaneus Wound Laterality: Right Cleanser: Soap and Water 3 x Per Week/30 Days Discharge Instructions: Gently cleanse wound with antibacterial soap, rinse and pat dry prior to dressing wounds Cleanser: Vashe 5.8 (oz) 3 x Per Week/30 Days Discharge Instructions: Use vashe 5.8 (oz) as directed Topical: Gentamicin 3 x Per Week/30 Days Discharge Instructions: Apply as directed by provider. Prim Dressing: Hydrofera Blue Ready Transfer Foam, 2.5x2.5 (in/in) 3 x Per Week/30 Days ary Discharge Instructions: Apply Hydrofera Blue Ready to wound bed as directed Secondary Dressing: ABD Pad 5x9 (in/in) 3 x Per Week/30 Days Discharge Instructions: Cover with ABD pad Secondary Dressing: Heel Cup 3 x Per Week/30 Days Secured With: [employer] or Non-Sterile  6-ply 4.5x4 (yd/yd) 3 x Per Week/30  Days Discharge Instructions: Apply Kerlix as directed Electronic Signature(s) Signed: 07/03/2023 12:45:55 PM By: Midge Aver MSN RN CNS WTA Signed: 07/06/2023 4:31:01 PM By: Allen Derry PA-C Entered By: Midge Aver on 07/03/2023 08:35:01 [name] ([id number]) [id number] Page 4 of 7 -------------------------------------------------------------------------------- Problem List Details Patient Name: Date of Service: [name] 07/03/2023 10:30 A M Medical Record Number: [id number] Patient Account Number: [id number] Date of Birth/Sex: Treating RN: 17-Jul-2006 (17 y.o. 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 [name] Primary Care Provider: PA Zenovia Jordan, NO Other Clinician: Referring Provider: Treating Provider/Extender: Barnabas Harries Weeks in Treatment: 2 Subjective Chief Complaint Information obtained from Patient Pressure ulcer left foot due to brace 06/17/2023; patient is here for review of a pressure area on the right plantar heel History of Present Illness (HPI) 01/20/2020 upon evaluation today patient presents for initial inspection here in  our clinic concerning issues that he has been having with his left foot laterally at OBERYN, BOUYEA ([id number]) [phone] Page 5 of 7 the fifth metatarsal location. It appears that he has issues unfortunately with chronic rubbing on his brace. He does have spina bifida and he has issues with this fairly frequently. With that being said the good news is this does not appear to be infected. The bad news is it is a recurrent issue. He is can be having surgery for foot reconstruction June 8. 01/27/2020 on evaluation today patient appears to be doing about the same in regard to his wound at this point. Fortunately there is no signs of active infection. Overall I feel like he is actually managing quite well. The wound is very macerated I think this is simply due to the fact that he has been using a Band-Aid as opposed to an absorptive bandage over top of the collagen. He really has nothing to drain into and then been putting over top of this Covan which is occluding it even more. 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 He wears an AFO brace on both feet. Noteworthy that he was here in 2021 for an area on the left foot He tells me that he has had a wound on the plantar right heel for the better part of a year. Prior to this this may have opened and closed repetitively. He has not been specifically dressing this and he became alarmed when it would not close and also that there may have been an odor noticeable. He is here for a review of this. Past medical history L4 spinal bifida with hydrocephalus Arnold-Chiari malformation, cavus deformity of the  right foot previous right foot surgery. ABI in our clinic  was 1.22 on the right 10/30; x-ray of the right heel was normal no osseous destruction. His PCR culture that I did last week showed E. coli various anaerobes and MSSA. We have been using Hydrofera Blue and a heel offloading boot 07-01-2023 upon evaluation today patient appears to be doing decently well currently in regard to his wound. Fortunately there does not appear to be any signs of active infection at this time. This is a gentleman whom I have seen previously about 3 years ago. He actually is doing decently well at this point he did have some debridement last week with Dr. Leanord Hawking he said to have a cast today he will be come back on Friday to change this out. 07-03-2023 upon evaluation today patient appears to be doing well currently in regard to his wound. He has been tolerating the dressing changes without complication. Fortunately there does not appear to be any signs of infection which is good news I think the cast that a great job. He is here for the first cast change to ensure nothing was rubbing the medial part of his ankle was the only place in question he thinks a bigger boot could potentially benefit him there. Objective Constitutional Well-nourished and well-hydrated in no acute distress. Vitals Time Taken: 11:13 AM, Height: 67 in, Weight: 130 lbs, BMI: 20.4, Temperature: 98.0 F, Pulse: 62 bpm, Respiratory Rate: 16 breaths/min, Blood Pressure: 115/81 mmHg. Respiratory normal breathing without difficulty. Psychiatric this patient is able to make decisions and demonstrates good insight into disease process. Alert and Oriented x 3. pleasant and cooperative. General Notes: Upon inspection patient's wound actually looks well no debridement needed today and he seems to be doing quite well in fact with regard to the overall appearance I am hopeful that he will continue to show signs of good improvement as we move forward in this process. Hopefully this will not take too long. I did go  ahead and reapply the total contact cast today he tolerated that application without complication. Integumentary (Hair, Skin) Wound #2 status is Open. Original cause of wound was Pressure Injury. The date acquired was: 06/16/2022. The wound has been in treatment 2 weeks. The wound is located on the Right Calcaneus. The wound measures 0.5cm length x 0.7cm width x 0.5cm depth; 0.275cm^2 area and 0.137cm^3 volume. There is Fat Layer (Subcutaneous Tissue) exposed. There is a medium amount of sanguinous drainage noted. There is small (1-33%) red, pink granulation within the wound bed. There is a medium (34-66%) amount of necrotic tissue within the wound bed including Adherent Slough. Assessment Active Problems ICD-10 Pressure ulcer of right heel, stage 3 Lumbar spina bifida without hydrocephalus Procedures Wound #2 [name] ([id number]) [id number] Page 6 of 7 Pre-procedure diagnosis of Wound #2 is a Pressure Ulcer located on the Right Calcaneus . There was a T Research scientist (life sciences) Procedure by Allen Derry, PA-C. otal Post procedure Diagnosis Wound #2: Same as Pre-Procedure Plan Follow-up Appointments: Return Appointment in 1 week. Bathing/ Shower/ Hygiene: Clean wound with Normal Saline or wound cleanser. Anesthetic (Use 'Patient Medications' Section for Anesthetic Order Entry): Lidocaine applied to wound bed Off-Loading: T Contact Cast to Right Lower Extremity otal WOUND #2: - Calcaneus Wound Laterality: Right Cleanser: Soap and Water 3 x Per Week/30 Days Discharge Instructions: Gently cleanse wound with antibacterial soap, rinse and pat dry prior to dressing wounds Cleanser: Vashe 5.8 (oz) 3 x Per Week/30 Days Discharge Instructions: Use vashe  5.8 (oz) as directed Topical: Gentamicin 3 x Per Week/30 Days Discharge Instructions: Apply as directed by provider. Prim Dressing: Hydrofera Blue Ready Transfer Foam, 2.5x2.5 (in/in) 3 x Per Week/30  Days ary Discharge Instructions: Apply Hydrofera Blue Ready to wound bed as directed Secondary Dressing: ABD Pad 5x9 (in/in) 3 x Per Week/30 Days Discharge Instructions: Cover with ABD pad Secondary Dressing: Heel Cup 3 x Per Week/30 Days Secured With: [employer] Sterile or Non-Sterile 6-ply 4.5x4 (yd/yd) 3 x Per Week/30 Days Discharge Instructions: Apply Kerlix as directed 1. I would recommend that we have the patient continue to monitor for any signs of infection or worsening. Based on what I am seeing I do believe that the patient is making really good headway here towards closure. 2. I am going to recommend as well patient should continue with the total contact casting which I feel like is doing really well as well. We will see patient back for reevaluation in 1 week here in the clinic. If anything worsens or changes patient will contact our office for additional recommendations. Electronic Signature(s) Signed: 07/03/2023 12:06:06 PM By: Allen Derry PA-C Entered By: Allen Derry on 07/03/2023 09:06:06 -------------------------------------------------------------------------------- Total Contact Cast Details Patient Name: Date of Service: [name] 07/03/2023 10:30 A M Medical Record Number: [id number] Patient Account Number: [id number] Date of Birth/Sex: Treating RN: [birth year] (17 y.o. [name] Primary Care Provider: PA Zenovia Jordan, NO Other Clinician: Referring Provider: Treating Provider/Extender: Barnabas Harries Weeks in Treatment: 2 T Contact Cast Applied for Wound Assessment: otal Wound #2 Right Calcaneus Performed By: Physician Allen Derry, PA-C The following information was scribed by: Midge Aver The information was scribed for: Allen Derry Post Procedure Diagnosis Same as Pre-procedure Electronic Signature(s) Signed: 07/03/2023 12:45:55 PM By: Midge Aver MSN RN CNS WTA Signed: 07/06/2023 4:31:01 PM By: Azzie Glatter, Cleophas Dunker  ([id number]) PM By: Allen Derry PA-C [phone].pdf Page 7 of 7 Signed: 07/06/2023 4:31:01 Entered By: Midge Aver on 07/03/2023 08:34:16 -------------------------------------------------------------------------------- SuperBill Details Patient Name: Date of Service: [name] 07/03/2023 Medical Record Number: [id number] Patient Account Number: [id number] Date of Birth/Sex: Treating RN: [birth year] (17 y.o. [name] Primary Care Provider: PA Zenovia Jordan, NO Other Clinician: Referring Provider: Treating Provider/Extender: Elizebeth Brooking, KRISTA Weeks in Treatment: 2 Diagnosis Coding ICD-10 Codes Code Description [phone] Pressure ulcer of right heel, stage 3 Q05.7 Lumbar spina bifida without hydrocephalus Facility Procedures : CPT4 Code: [id number] Description: [phone] - APPLY TOTAL CONTACT LEG CAST ICD-10 Diagnosis Description L89.613 Pressure ulcer of right heel, stage 3 Modifier: Quantity: 1 Physician Procedures : CPT4 Code Description Modifier [id number] - WC PHYS APPLY TOTAL CONTACT CAST ICD-10 Diagnosis Description L89.613 Pressure ulcer of right heel, stage 3 Quantity: 1 Electronic Signature(s) Signed: 07/03/2023 12:06:36 PM By: Allen Derry PA-C Previous Signature: 07/03/2023 12:06:13 PM Version By: Allen Derry PA-C Entered By: Allen Derry on 07/03/2023 09:06:36

## 2023-07-03 NOTE — Progress Notes (Addendum)
[name] ([id number]) [id number] Page 1 of 9 Visit Report for 07/03/2023 Arrival Information Details Patient Name: Date of Service: [name] 07/03/2023 10:30 A M Medical Record Number: [id number] Patient Account Number: [id number] Date of Birth/Sex: Treating RN: [birth year] (17 y.o. [name] Primary Care [name]: PA [name], NO Other Clinician: Referring [name]: Treating [name]/Extender: [name] Weeks in Treatment: 2 Visit Information History Since Last Visit Added or deleted any medications: No Patient Arrived: Ambulatory Any Avery allergies or adverse reactions: No Arrival Time: 11:10 Has Dressing in Place as Prescribed: Yes Accompanied By: self Has Footwear/Offloading in Place as Prescribed: Yes Transfer Assistance: None Right: T Contact Cast otal Patient Identification Verified: Yes Pain Present Now: No Secondary Verification Process Completed: Yes Patient Requires Transmission-Based Precautions: No Patient Has Alerts: No Electronic Signature(s) Signed: 07/03/2023 12:45:55 PM By: Kevin Aver MSN RN CNS WTA Entered By: [name] on 07/03/2023 11:11:00 -------------------------------------------------------------------------------- Clinic Level of Care Assessment Details Patient Name: Date of Service: [name] 07/03/2023 10:30 A M Medical Record Number: [id number] Patient Account Number: [id number] Date of Birth/Sex: Treating RN: 2005/10/15 (17 y.o. [name] Primary Care [name]: PA [name], NO Other Clinician: Referring [name]: Treating [name]/Extender: [name] Weeks in Treatment: 2 Clinic Level of Care Assessment Items TOOL 1 Quantity Score []  - 0 Use when EandM and Procedure is performed on INITIAL visit ASSESSMENTS - Nursing Assessment / Reassessment []  - 0 General Physical Exam (combine w/ comprehensive assessment (listed just below) when performed on  Avery pt. evals) []  - 0 Comprehensive Assessment (HX, ROS, Risk Assessments, Wounds Hx, etc.) ASSESSMENTS - Wound and Skin Assessment / Reassessment []  - 0 Dermatologic / Skin Assessment (not related to wound area) ASSESSMENTS - Ostomy and/or Continence Assessment and Care Kevin, Avery ([id number]) [id number] Page 2 of 9 []  - 0 Incontinence Assessment and Management []  - 0 Ostomy Care Assessment and Management (repouching, etc.) PROCESS - Coordination of Care []  - 0 Simple Patient / Family Education for ongoing care []  - 0 Complex (extensive) Patient / Family Education for ongoing care []  - 0 Staff obtains Chiropractor, Records, T Results / Process Orders est []  - 0 Staff telephones HHA, Nursing Homes / Clarify orders / etc []  - 0 Routine Transfer to another Facility (non-emergent condition) []  - 0 Routine Hospital Admission (non-emergent condition) []  - 0 Avery Admissions / Manufacturing engineer / Ordering NPWT Apligraf, etc. , []  - 0 Emergency Hospital Admission (emergent condition) PROCESS - Special Needs []  - 0 Pediatric / Minor Patient Management []  - 0 Isolation Patient Management []  - 0 Hearing / Language / Visual special needs []  - 0 Assessment of Community assistance (transportation, D/C planning, etc.) []  - 0 Additional assistance / Altered mentation []  - 0 Support Surface(s) Assessment (bed, cushion, seat, etc.) INTERVENTIONS - Miscellaneous []  - 0 External ear exam []  - 0 Patient Transfer (multiple staff / Nurse, adult / Similar devices) []  - 0 Simple Staple / Suture removal (25 or less) []  - 0 Complex Staple / Suture removal (26 or more) []  - 0 Hypo/Hyperglycemic Management (do not check if billed separately) []  - 0 Ankle / Brachial Index (ABI) - do not check if billed separately Has the patient been seen at the hospital within the last three years: Yes Total Score: 0 Level Of Care: ____ Electronic Signature(s) Signed:  07/03/2023 12:45:55 PM By: Kevin Aver MSN RN CNS WTA Entered By: [name] on 07/03/2023 11:35:08 --------------------------------------------------------------------------------  Encounter Discharge Information Details Patient Name: Date of Service: [name] 07/03/2023 10:30 A M Medical Record Number: [id number] Patient Account Number: [id number] Date of Birth/Sex: Treating RN: August 17, 2006 (17 y.o. [name] Primary Care [name]: PA [name], NO Other Clinician: Referring [name]: Treating [name]/Extender: [name] Weeks in Treatment: 2 Encounter Discharge Information Items Discharge Condition: [name], [name] ([id number]) [id number] Page 3 of 9 Ambulatory Status: Ambulatory Discharge Destination: Home Transportation: Private Auto Accompanied By: self Schedule Follow-up Appointment: Yes Clinical Summary of Care: Electronic Signature(s) Signed: 07/03/2023 12:45:55 PM By: Kevin Aver MSN RN CNS WTA Entered By: [name] on 07/03/2023 11:36:24 -------------------------------------------------------------------------------- Lower Extremity Assessment Details Patient Name: Date of Service: [name] 07/03/2023 10:30 A M Medical Record Number: [id number] Patient Account Number: [id number] Date of Birth/Sex: Treating RN: November 02, 2005 (17 y.o. [name] Primary Care [name]: PA [name], NO Other Clinician: Referring [name]: Treating Ziyonna Christner/Extender: [name] Weeks in Treatment: 2 Electronic Signature(s) Signed: 07/03/2023 12:45:55 PM By: Kevin Aver MSN RN CNS WTA Entered By: [name] on 07/03/2023 11:24:46 -------------------------------------------------------------------------------- Multi Wound Chart Details Patient Name: Date of Service: [name] 07/03/2023 10:30 A M Medical Record Number: [id number] Patient Account Number: [id number] Date of Birth/Sex:  Treating RN: [birth year] (17 y.o. [name] Primary Care [name]: PA [name], NO Other Clinician: Referring Dontavious Avery: Treating [name]/Extender: [name], KRISTA Weeks in Treatment: 2 Vital Signs Height(in): 67 Pulse(bpm): 62 Weight(lbs): 130 Blood Pressure(mmHg): 115/81 Body Mass Index(BMI): 20.4 Temperature(F): 98.0 Respiratory Rate(breaths/min): 16 [2:Photos:] [N/A:N/A] Right Calcaneus N/A N/A Wound Location: Pressure Injury N/A N/A Wounding Event: Pressure Ulcer N/A N/A Primary Etiology: 06/16/2022 N/A N/A Date Acquired: 2 N/A N/A Weeks of Treatment: Open N/A N/A Wound Status: No N/A N/A Wound Recurrence: 0.5x0.7x0.5 N/A N/A Measurements L x W x D (cm) 0.275 N/A N/A A (cm) : rea 0.137 N/A N/A Volume (cm) : 66.70% N/A N/A % Reduction in A rea: 72.30% N/A N/A % Reduction in Volume: Category/Stage III N/A N/A Classification: Medium N/A N/A Exudate A mount: Sanguinous N/A N/A Exudate Type: red N/A N/A Exudate Color: Small (1-33%) N/A N/A Granulation A mount: Red, Pink N/A N/A Granulation Quality: Medium (34-66%) N/A N/A Necrotic A mount: Fat Layer (Subcutaneous Tissue): Yes N/A N/A Exposed Structures: Fascia: No Tendon: No Muscle: No Joint: No Bone: No None N/A N/A Epithelialization: Treatment Notes Electronic Signature(s) Signed: 07/03/2023 12:45:55 PM By: Kevin Aver MSN RN CNS WTA Entered By: [name] on 07/03/2023 11:32:57 -------------------------------------------------------------------------------- Multi-Disciplinary Care Plan Details Patient Name: Date of Service: [name] 07/03/2023 10:30 A M Medical Record Number: [id number] Patient Account Number: [id number] Date of Birth/Sex: Treating RN: 2006/07/02 (17 y.o. [name] Primary Care Deondre Marinaro: PA [name], NO Other Clinician: Referring Jannet Calip: Treating Pavan Bring/Extender: [name] Weeks in Treatment: 2 Active  Inactive Necrotic Tissue Nursing Diagnoses: Impaired tissue integrity related to necrotic/devitalized tissue Knowledge deficit related to management of necrotic/devitalized tissue Goals: Necrotic/devitalized tissue will be minimized in the wound bed Date Initiated: 06/18/2023 Target Resolution Date: 07/18/2023 Goal Status: Active Patient/caregiver will verbalize understanding of reason and process for debridement of necrotic tissue Date Initiated: 06/18/2023 Target Resolution Date: 07/18/2023 ZARON, VOELTZ ([id number]) [phone] Page 5 of 9 Goal Status: Active Interventions: Assess patient pain level pre-, during and post procedure and prior to discharge Provide education on necrotic tissue and debridement process Treatment Activities: Excisional debridement : 06/17/2023 Notes: Pressure Nursing  Diagnoses: Knowledge deficit related to causes and risk factors for pressure ulcer development Knowledge deficit related to management of pressures ulcers Potential for impaired tissue integrity related to pressure, friction, moisture, and shear Goals: Patient will remain free from development of additional pressure ulcers Date Initiated: 06/18/2023 Target Resolution Date: 07/18/2023 Goal Status: Active Patient will remain free of pressure ulcers Date Initiated: 06/18/2023 Target Resolution Date: 07/18/2023 Goal Status: Active Patient/caregiver will verbalize risk factors for pressure ulcer development Date Initiated: 06/18/2023 Target Resolution Date: 07/11/2023 Goal Status: Active Patient/caregiver will verbalize understanding of pressure ulcer management Date Initiated: 06/18/2023 Target Resolution Date: 06/17/2023 Goal Status: Active Interventions: Assess offloading mechanisms upon admission and as needed Assess potential for pressure ulcer upon admission and as needed Provide education on pressure ulcers Notes: Wound/Skin Impairment Nursing  Diagnoses: Impaired tissue integrity Knowledge deficit related to ulceration/compromised skin integrity Goals: Patient/caregiver will verbalize understanding of skin care regimen Date Initiated: 06/18/2023 Target Resolution Date: 07/18/2023 Goal Status: Active Ulcer/skin breakdown will have a volume reduction of 30% by week 4 Date Initiated: 06/18/2023 Target Resolution Date: 07/18/2023 Goal Status: Active Ulcer/skin breakdown will have a volume reduction of 50% by week 8 Date Initiated: 06/18/2023 Target Resolution Date: 08/17/2023 Goal Status: Active Ulcer/skin breakdown will have a volume reduction of 80% by week 12 Date Initiated: 06/18/2023 Target Resolution Date: 09/17/2023 Goal Status: Active Ulcer/skin breakdown will heal within 14 weeks Date Initiated: 06/18/2023 Target Resolution Date: 10/01/2023 Goal Status: Active Interventions: Assess patient/caregiver ability to obtain necessary supplies Assess patient/caregiver ability to perform ulcer/skin care regimen upon admission and as needed Assess ulceration(s) every visit Provide education on ulcer and skin care Treatment Activities: Skin care regimen initiated : 06/17/2023 Notes: Electronic Signature(s) Signed: 07/03/2023 12:45:55 PM By: Kevin Aver MSN RN CNS WTA Entered By: [name] on 07/03/2023 11:35:35 [name] ([id number]) [id number] Page 6 of 9 -------------------------------------------------------------------------------- Pain Assessment Details Patient Name: Date of Service: [name] 07/03/2023 10:30 A M Medical Record Number: [id number] Patient Account Number: [id number] Date of Birth/Sex: Treating RN: 2006/04/03 (17 y.o. [name] Primary Care Shrihan Putt: PA [name], NO Other Clinician: Referring Schyler Counsell: Treating Anand Tejada/Extender: [name], KRISTA Weeks in Treatment: 2 Active Problems Location of Pain Severity and Description of  Pain Patient Has Paino No Site Locations Pain Management and Medication Current Pain Management: Electronic Signature(s) Signed: 07/03/2023 12:45:55 PM By: Kevin Aver MSN RN CNS WTA Entered By: [name] on 07/03/2023 11:13:29 -------------------------------------------------------------------------------- Patient/Caregiver Education Details Patient Name: Date of Service: [name] 11/8/2024andnbsp10:30 A M Medical Record Number: [id number] Patient Account Number: [id number] Date of Birth/Gender: Treating RN: Feb 16, 2006 (17 y.o. [name] Primary Care Physician: PA [name], [place] Other Clinician: Referring Physician: Treating Physician/Extender: [name] [place], [name] ([id number]) [id number] Page 7 of 9 Weeks in Treatment: 2 Education Assessment Education Provided To: Patient Education Topics Provided Wound/Skin Impairment: Handouts: Caring for Your Ulcer Methods: Explain/Verbal Responses: State content correctly Electronic Signature(s) Signed: 07/03/2023 12:45:55 PM By: Kevin Aver MSN RN CNS WTA Entered By: [name] on 07/03/2023 11:35:44 -------------------------------------------------------------------------------- Wound Assessment Details Patient Name: Date of Service: [name] 07/03/2023 10:30 A M Medical Record Number: [id number] Patient Account Number: [id number] Date of Birth/Sex: Treating RN: [birth year] (17 y.o. [name] Primary Care Jamiyah Dingley: PA [name], NO Other Clinician: Referring Derrell Milanes: Treating Ruqayyah Lute/Extender: [name], KRISTA Weeks in Treatment: 2 Wound Status Wound Number: 2 Primary Etiology: Pressure Ulcer Wound Location: Right Calcaneus Wound  Status: Open Wounding Event: Pressure Injury Date Acquired: 06/16/2022 Weeks Of Treatment: 2 Clustered Wound: No Photos Wound Measurements Length: (cm) 0.5 Width: (cm) 0.7 Depth: (cm) 0.5 Area: (cm)  0.275 Volume: (cm) 0.137 % Reduction in Area: 66.7% % Reduction in Volume: 72.3% Epithelialization: None Wound Description Classification: Category/Stage III [name] ([id number]) Exudate Amount: Medium Exudate Type: Sanguinous Exudate Color: red Foul Odor After Cleansing: No [id number] Page 8 of 9 Slough/Fibrino Yes Wound Bed Granulation Amount: Small (1-33%) Exposed Structure Granulation Quality: Red, Pink Fascia Exposed: No Necrotic Amount: Medium (34-66%) Fat Layer (Subcutaneous Tissue) Exposed: Yes Necrotic Quality: Adherent Slough Tendon Exposed: No Muscle Exposed: No Joint Exposed: No Bone Exposed: No Treatment Notes Wound #2 (Calcaneus) Wound Laterality: Right Cleanser Soap and Water Discharge Instruction: Gently cleanse wound with antibacterial soap, rinse and pat dry prior to dressing wounds Vashe 5.8 (oz) Discharge Instruction: Use vashe 5.8 (oz) as directed Peri-Wound Care Topical Gentamicin Discharge Instruction: Apply as directed by Keino Placencia. Primary Dressing Hydrofera Blue Ready Transfer Foam, 2.5x2.5 (in/in) Discharge Instruction: Apply Hydrofera Blue Ready to wound bed as directed Secondary Dressing ABD Pad 5x9 (in/in) Discharge Instruction: Cover with ABD pad Heel Cup Secured With [employer] Sterile or Non-Sterile 6-ply 4.5x4 (yd/yd) Discharge Instruction: Apply Kerlix as directed Compression Wrap Compression Stockings Add-Ons Electronic Signature(s) Signed: 07/03/2023 12:45:55 PM By: Kevin Aver MSN RN CNS WTA Entered By: [name] on 07/03/2023 11:24:30 -------------------------------------------------------------------------------- Vitals Details Patient Name: Date of Service: [name] 07/03/2023 10:30 A M Medical Record Number: [id number] Patient Account Number: [id number] Date of Birth/Sex: Treating RN: [birth year] (17 y.o. [name] Primary Care Kalel Harty: PA [name], NO Other  Clinician: Referring Lycan Davee: Treating Quita Mcgrory/Extender: [name], KRISTA Weeks in Treatment: 2 [name] ([id number]) [id number] Page 9 of 9 Vital Signs Time Taken: 11:13 Temperature (F): 98.0 Height (in): 67 Pulse (bpm): 62 Weight (lbs): 130 Respiratory Rate (breaths/min): 16 Body Mass Index (BMI): 20.4 Blood Pressure (mmHg): 115/81 Reference Range: 80 - 120 mg / dl Electronic Signature(s) Signed: 07/03/2023 12:45:55 PM By: Kevin Aver MSN RN CNS WTA Entered By: [name] on 07/03/2023 11:13:24

## 2023-07-08 ENCOUNTER — Encounter: Payer: Medicaid Other | Admitting: Physician Assistant

## 2023-07-08 DIAGNOSIS — L89613 Pressure ulcer of right heel, stage 3: Secondary | ICD-10-CM | POA: Diagnosis not present

## 2023-07-08 NOTE — Progress Notes (Signed)
[name] ([id number]) [id number] Page 1 of 7 Visit Report for 07/08/2023 Chief Complaint Document Details Patient Name: Date of Service: [name] 07/08/2023 11:00 A M Medical Record Number: [id number] Patient Account Number: [id number] Date of Birth/Sex: Treating RN: Jan 08, 2006 (17 y.o. [name]) Yevonne Pax Primary Care Provider: PA Zenovia Jordan, NO Other Clinician: Betha Loa Referring Provider: Treating Provider/Extender: Elizebeth Brooking, KRISTA Weeks in Treatment: 3 Information Obtained from: Patient Chief Complaint Pressure ulcer left foot due to brace 06/17/2023; patient is here for review of a pressure area on the right plantar heel Electronic Signature(s) Signed: 07/08/2023 11:23:04 AM By: Allen Derry PA-C Entered By: Allen Derry on 07/08/2023 11:23:04 -------------------------------------------------------------------------------- HPI Details Patient Name: Date of Service: [name] 07/08/2023 11:00 A M Medical Record Number: [id number] Patient Account Number: [id number] Date of Birth/Sex: Treating RN: 11/28/2005 (17 y.o. [name] Primary Care Provider: PA Zenovia Jordan, NO Other Clinician: Betha Loa Referring Provider: Treating Provider/Extender: Elizebeth Brooking, KRISTA Weeks in Treatment: 3 History of Present Illness HPI Description: 01/20/2020 upon evaluation today patient presents for initial inspection here in our clinic concerning issues that he has been having with his left foot laterally at the fifth metatarsal location. It appears that he has issues unfortunately with chronic rubbing on his brace. He does have spina bifida and he has issues with this fairly frequently. With that being said the good news is this does not appear to be infected. The bad news is it is a recurrent issue. He is can be having surgery for foot reconstruction June 8. 01/27/2020 on evaluation today patient appears to be doing about  the same in regard to his wound at this point. Fortunately there is no signs of active infection. Overall I feel like he is actually managing quite well. The wound is very macerated I think this is simply due to the fact that he has been using a Band-Aid as opposed to an absorptive bandage over top of the collagen. He really has nothing to drain into and then been putting over top of this Covan which is occluding it even more. 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 He wears an AFO brace on both feet. Noteworthy that he was here in 2021 for an area on the left foot [name] ([id number]) [id number] Page 2 of 7 He tells me that he has had a wound on the plantar right heel for the better part of a year. Prior to this this may have opened and closed repetitively. He has not been specifically dressing this and he became alarmed when it would not close and also that there may have been an odor noticeable. He is here for a review of this. 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 ABI in our clinic was 1.22 on the right 10/30; x-ray of the right heel was normal no osseous destruction. His PCR culture that I did last week showed E. coli various anaerobes and MSSA. We have been using Hydrofera Blue and a heel offloading boot 07-01-2023 upon evaluation today patient appears to be doing decently well currently in regard to his wound. Fortunately  there does not appear to be any signs of active infection at this time. This is a gentleman whom I have seen previously about 3 years ago. He actually is doing  decently well at this point he did have some debridement last week with Dr. Leanord Hawking he said to have a cast today he will be come back on Friday to change this out. 07-03-2023 upon evaluation today patient appears to be doing well currently in regard to his wound. He has been tolerating the dressing changes without complication. Fortunately there does not appear to be any signs of infection which is good news I think the cast that a great job. He is here for the first cast change to ensure nothing was rubbing the medial part of his ankle was the only place in question he thinks a bigger boot could potentially benefit him there. 07-08-2023 patient's wound currently is showing signs of good improvement I am actually very pleased with where we stand is already measuring smaller and looking better I feel that the depth is filling in and I am very pleased in that regard. I do not see any signs of infection the cast seems to be doing well with this and make sure to pad his ankle area to make sure that nothing is rubbing in this region. Electronic Signature(s) Signed: 07/08/2023 3:32:35 PM By: Allen Derry PA-C Entered By: Allen Derry on 07/08/2023 15:32:35 -------------------------------------------------------------------------------- Physical Exam Details Patient Name: Date of Service: [name] 07/08/2023 11:00 A M Medical Record Number: [id number] Patient Account Number: [id number] Date of Birth/Sex: Treating RN: 07/24/2006 (17 y.o. [name] Primary Care Provider: PA Fidela Juneau Other Clinician: Betha Loa Referring Provider: Treating Provider/Extender: Elizebeth Brooking, KRISTA Weeks in Treatment: 3 Constitutional Well-nourished and well-hydrated in no acute distress. Respiratory normal breathing without difficulty. Psychiatric this patient is able to make decisions and demonstrates good insight into disease process. Alert and Oriented x 3. pleasant and  cooperative. Notes Upon inspection patient's wound bed actually showed signs of good granulation epithelization at this point. Fortunately I see no signs of active infection which is great news and in general I think that we are doing quite well at this point. No fevers, chills, nausea, vomiting, or diarrhea. Electronic Signature(s) Signed: 07/08/2023 3:32:50 PM By: Allen Derry PA-C Entered By: Allen Derry on 07/08/2023 15:32:50 [name] ([id number]) [id number] Page 3 of 7 -------------------------------------------------------------------------------- Physician Orders Details Patient Name: Date of Service: [name] 07/08/2023 11:00 A M Medical Record Number: [id number] Patient Account Number: [id number] Date of Birth/Sex: Treating RN: 2006-04-21 (17 y.o. 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 Bathing/ Shower/ Hygiene Clean wound with Normal Saline or wound cleanser. Anesthetic (Use 'Patient Medications' Section for Anesthetic Order Entry) Lidocaine applied to wound bed Off-Loading Total Contact Cast to Right Lower Extremity Wound Treatment Wound #2 - Calcaneus Wound Laterality: Right Cleanser: Soap and Water 3 x Per Week/30 Days Discharge Instructions: Gently cleanse wound with antibacterial soap, rinse and pat dry prior to dressing wounds Cleanser: Vashe 5.8 (oz) 3 x Per Week/30 Days Discharge Instructions: Use vashe  5.8 (oz) as directed Prim Dressing: Promogran Matrix  4.34 (in) 3 x Per Week/30 Days ary Discharge Instructions: Moisten w/normal saline or sterile water; Cover wound as directed. Do not remove from wound bed. Secondary Dressing: ABD Pad 5x9 (in/in) 3 x Per Week/30 Days Discharge Instructions: Cover with ABD pad Secondary Dressing: Heel Cup 3 x Per Week/30 Days Secured With: [employer] Sterile or Non-Sterile 6-ply 4.5x4 (yd/yd) 3 x Per Week/30 Days Discharge Instructions: Apply Kerlix as directed Electronic Signature(s) Signed: 07/08/2023 4:52:03 PM By: Betha Loa Signed: 07/09/2023 7:57:50 AM By: Allen Derry PA-C Entered By: Betha Loa on 07/08/2023 11:59:09 [name] ([id number]) [id number] Page 4 of 7 -------------------------------------------------------------------------------- Problem List Details Patient Name: Date of Service: [name] 07/08/2023 11:00 A M Medical Record Number: [id number] Patient Account Number: [id number] Date of Birth/Sex: Treating RN: 05/26/2006 (17 y.o. 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 [name] Primary Care Provider: PA Fidela Juneau Other  Clinician: Betha Loa Referring Provider: Treating Provider/Extender: Elizebeth Brooking, KRISTA Weeks in Treatment: 3 Subjective Chief Complaint Information obtained from Patient Pressure ulcer left foot due to brace 06/17/2023; patient is here for review of a pressure area on the right plantar heel History of Present Illness (HPI) 01/20/2020 upon evaluation today patient presents for initial inspection here in our clinic concerning issues that he has been having with his left foot laterally at KYPTON, ARCIA ([id number]) [id number].pdf Page 5 of 7 the fifth metatarsal location. It appears that he has issues unfortunately with chronic rubbing on his brace. He does have spina bifida and he has issues with this fairly frequently. With that being said the good news is this does not appear to be infected. The bad news is it is a recurrent issue. He is can be having surgery for foot reconstruction June 8. 01/27/2020 on evaluation today patient appears to be doing about the same in regard to his wound at this point. Fortunately there is no signs of active infection. Overall I feel like he is actually managing quite well. The wound is very macerated I think this is simply due to the fact that he has been using a Band-Aid as opposed to an absorptive bandage over top of the collagen. He really has nothing to drain into and then been putting over top of this Covan which is occluding it even more. 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 He wears an AFO brace on both feet. Noteworthy that he was here in 2021 for an area on the left foot He tells me that he has had a wound on the plantar  right heel for the  better part of a year. Prior to this this may have opened and closed repetitively. He has not been specifically dressing this and he became alarmed when it would not close and also that there may have been an odor noticeable. He is here for a review of this. 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 ABI in our clinic was 1.22 on the right 10/30; x-ray of the right heel was normal no osseous destruction. His PCR culture that I did last week showed E. coli various anaerobes and MSSA. We have been using Hydrofera Blue and a heel offloading boot 07-01-2023 upon evaluation today patient appears to be doing decently well currently in regard to his wound. Fortunately there does not appear to be any signs of active infection at this time. This is a gentleman whom I have seen previously about 3 years ago. He actually is doing decently well at this point he did have some debridement last week with Dr. Leanord Hawking he said to have a cast today he will be come back on Friday to change this out. 07-03-2023 upon evaluation today patient appears to be doing well currently in regard to his wound. He has been tolerating the dressing changes without complication. Fortunately there does not appear to be any signs of infection which is good news I think the cast that a great job. He is here for the first cast change to ensure nothing was rubbing the medial part of his ankle was the only place in question he thinks a bigger boot could potentially benefit him there. 07-08-2023 patient's wound currently is showing signs of good improvement I am actually very pleased with where we stand is already measuring smaller and looking better I feel that the depth is filling in and I am very pleased in that regard. I do not see any signs of infection the cast seems to be doing well with this and make sure to pad his ankle area to make  sure that nothing is rubbing in this region. Objective Constitutional Well-nourished and well-hydrated in no acute distress. Vitals Time Taken: 11:23 AM, Height: 67 in, Weight: 130 lbs, BMI: 20.4, Temperature: 97.3 F, Pulse: 69 bpm, Respiratory Rate: 18 breaths/min, Blood Pressure: 111/73 mmHg. Respiratory normal breathing without difficulty. Psychiatric this patient is able to make decisions and demonstrates good insight into disease process. Alert and Oriented x 3. pleasant and cooperative. General Notes: Upon inspection patient's wound bed actually showed signs of good granulation epithelization at this point. Fortunately I see no signs of active infection which is great news and in general I think that we are doing quite well at this point. No fevers, chills, nausea, vomiting, or diarrhea. Integumentary (Hair, Skin) Wound #2 status is Open. Original cause of wound was Pressure Injury. The date acquired was: 06/16/2022. The wound has been in treatment 3 weeks. The wound is located on the Right Calcaneus. The wound measures 0.7cm length x 0.8cm width x 0.2cm depth; 0.44cm^2 area and 0.088cm^3 volume. There is Fat Layer (Subcutaneous Tissue) exposed. There is a medium amount of sanguinous drainage noted. There is small (1-33%) red, pink granulation within the wound bed. There is a medium (34-66%) amount of necrotic tissue within the wound bed including Adherent Slough. Assessment Active Problems ICD-10 Pressure ulcer of right heel, stage 3 Lumbar spina bifida without hydrocephalus Procedures [name] ([id number]) [phone].pdf Page 6 of 7 Wound #2 Pre-procedure  diagnosis of Wound #2 is a Pressure Ulcer located on the Right Calcaneus . There was a T Research scientist (life sciences) Procedure by Allen Derry, PA-C. otal Post procedure Diagnosis Wound #2: Same as Pre-Procedure Plan Follow-up Appointments: Return Appointment in 1 week. Bathing/ Shower/ Hygiene: Clean wound  with Normal Saline or wound cleanser. Anesthetic (Use 'Patient Medications' Section for Anesthetic Order Entry): Lidocaine applied to wound bed Off-Loading: T Contact Cast to Right Lower Extremity otal WOUND #2: - Calcaneus Wound Laterality: Right Cleanser: Soap and Water 3 x Per Week/30 Days Discharge Instructions: Gently cleanse wound with antibacterial soap, rinse and pat dry prior to dressing wounds Cleanser: Vashe 5.8 (oz) 3 x Per Week/30 Days Discharge Instructions: Use vashe 5.8 (oz) as directed Prim Dressing: Promogran Matrix 4.34 (in) 3 x Per Week/30 Days ary Discharge Instructions: Moisten w/normal saline or sterile water; Cover wound as directed. Do not remove from wound bed. Secondary Dressing: ABD Pad 5x9 (in/in) 3 x Per Week/30 Days Discharge Instructions: Cover with ABD pad Secondary Dressing: Heel Cup 3 x Per Week/30 Days Secured With: Kerlix Roll Sterile or Non-Sterile 6-ply 4.5x4 (yd/yd) 3 x Per Week/30 Days Discharge Instructions: Apply Kerlix as directed 1. I am going to recommend that we have the patient actually continue to monitor for any signs of infection or worsening. I do believe based on what I am seeing that we should continue with the collagen which I think is doing quite well. 2. I am also going to recommend that the patient should continue with the ABD pads to cover followed by the heel cup of the ankle with no try to protect this is much as possible. We will see patient back for reevaluation in 1 week here in the clinic. If anything worsens or changes patient will contact our office for additional recommendations. Electronic Signature(s) Signed: 07/08/2023 3:33:50 PM By: Allen Derry PA-C Entered By: Allen Derry on 07/08/2023 15:33:50 -------------------------------------------------------------------------------- Total Contact Cast Details Patient Name: Date of Service: [name] 07/08/2023 11:00 A M Medical Record Number: [id number] Patient  Account Number: [id number] Date of Birth/Sex: Treating RN: 02/16/2006 (17 y.o. [name]) Yevonne Pax Primary Care Provider: PA Fidela Juneau Other Clinician: Betha Loa Referring Provider: Treating Provider/Extender: Elizebeth Brooking, KRISTA Weeks in Treatment: 3 T Contact Cast Applied for Wound Assessment: otal Wound #2 Right Calcaneus Performed By: Physician Allen Derry, PA-C The following information was scribed by: Betha Loa The information was scribed for: Allen Derry Post Procedure Diagnosis Same as Pre-procedure Electronic Signature(s) Signed: 07/08/2023 4:52:03 PM By: Alben Deeds ([id number]) [id number] Page 7 of 7 Signed: 07/09/2023 7:57:50 AM By: Allen Derry PA-C Entered By: Betha Loa on 07/08/2023 11:56:27 -------------------------------------------------------------------------------- SuperBill Details Patient Name: Date of Service: [name] 07/08/2023 Medical Record Number: [id number] Patient Account Number: [id number] Date of Birth/Sex: Treating RN: 09-Feb-2006 (17 y.o. 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

## 2023-07-08 NOTE — Progress Notes (Signed)
[name] ([id number]) [id number] Page 1 of 9 Visit Report for 07/08/2023 Arrival Information Details Patient Name: Date of Service: [name] 07/08/2023 11:00 A M Medical Record Number: [id number] Patient Account Number: [id number] Date of Birth/Sex: Treating RN: 2005/09/01 (17 y.o. [name]) Yevonne Pax Primary Care Timisha Mondry: PA Zenovia Jordan, NO Other Clinician: Betha Loa Referring Kaizen Ibsen: Treating Melesa Lecy/Extender: Barnabas Harries Weeks in Treatment: 3 Visit Information History Since Last Visit All ordered tests and consults were completed: No Patient Arrived: Ambulatory Added or deleted any medications: No Arrival Time: 11:15 Any Avery allergies or adverse reactions: No Transfer Assistance: None Had a fall or experienced change in No Patient Identification Verified: Yes activities of daily living that may affect Secondary Verification Process Completed: Yes risk of falls: Patient Requires Transmission-Based Precautions: No Signs or symptoms of abuse/neglect since last visito No Patient Has Alerts: No Hospitalized since last visit: No Implantable device outside of the clinic excluding No cellular tissue based products placed in the center since last visit: Has Dressing in Place as Prescribed: Yes Has Footwear/Offloading in Place as Prescribed: Yes Right: T Contact Cast otal Pain Present Now: No Electronic Signature(s) Signed: 07/08/2023 4:52:03 PM By: Betha Loa Entered By: Betha Loa on 07/08/2023 11:22:56 -------------------------------------------------------------------------------- Clinic Level of Care Assessment Details Patient Name: Date of Service: [name] 07/08/2023 11:00 A M Medical Record Number: [id number] Patient Account Number: [id number] Date of Birth/Sex: Treating RN: May 23, 2006 (17 y.o. [name]) Yevonne Pax Primary Care Dietrich Ke: PA Fidela Juneau Other Clinician: Betha Loa Referring  Ivy Puryear: Treating Baudelio Karnes/Extender: Elizebeth Brooking, KRISTA Weeks in Treatment: 3 Clinic Level of Care Assessment Items TOOL 1 Quantity Score []  - 0 Use when EandM and Procedure is performed on INITIAL visit ASSESSMENTS - Nursing Assessment / Reassessment []  - 0 General Physical Exam (combine w/ comprehensive assessment (listed just below) when performed on Avery pt. [address] ([id number]) [id number] Page 2 of 9 []  - 0 Comprehensive Assessment (HX, ROS, Risk Assessments, Wounds Hx, etc.) ASSESSMENTS - Wound and Skin Assessment / Reassessment []  - 0 Dermatologic / Skin Assessment (not related to wound area) ASSESSMENTS - Ostomy and/or Continence Assessment and Care []  - 0 Incontinence Assessment and Management []  - 0 Ostomy Care Assessment and Management (repouching, etc.) PROCESS - Coordination of Care []  - 0 Simple Patient / Family Education for ongoing care []  - 0 Complex (extensive) Patient / Family Education for ongoing care []  - 0 Staff obtains Chiropractor, Records, T Results / Process Orders est []  - 0 Staff telephones HHA, Nursing Homes / Clarify orders / etc []  - 0 Routine Transfer to another Facility (non-emergent condition) []  - 0 Routine Hospital Admission (non-emergent condition) []  - 0 Avery Admissions / Manufacturing engineer / Ordering NPWT Apligraf, etc. , []  - 0 Emergency Hospital Admission (emergent condition) PROCESS - Special Needs []  - 0 Pediatric / Minor Patient Management []  - 0 Isolation Patient Management []  - 0 Hearing / Language / Visual special needs []  - 0 Assessment of Community assistance (transportation, D/C planning, etc.) []  - 0 Additional assistance / Altered mentation []  - 0 Support Surface(s) Assessment (bed, cushion, seat, etc.) INTERVENTIONS - Miscellaneous []  - 0 External ear exam []  - 0 Patient Transfer (multiple staff / Nurse, adult / Similar devices) []  - 0 Simple Staple /  Suture removal (25 or less) []  - 0 Complex Staple / Suture removal (26 or more) []  - 0 Hypo/Hyperglycemic Management (do not check if billed separately) []  -  0 Ankle / Brachial Index (ABI) - do not check if billed separately Has the patient been seen at the hospital within the last three years: Yes Total Score: 0 Level Of Care: ____ Electronic Signature(s) Signed: 07/08/2023 4:52:03 PM By: Betha Loa Entered By: Betha Loa on 07/08/2023 11:59:15 -------------------------------------------------------------------------------- Encounter Discharge Information Details Patient Name: Date of Service: [name] 07/08/2023 11:00 A M Medical Record Number: [id number] Patient Account Number: [id number] Date of Birth/Sex: Treating RN: 2006/02/04 ([address] y.o. [name], Cleophas Dunker ([id number]) [id number] Page 3 of 9 Primary Care Imanie Darrow: PA Zenovia Jordan, NO Other Clinician: Betha Loa Referring Tanor Glaspy: Treating Hatley Henegar/Extender: Barnabas Harries Weeks in Treatment: 3 Encounter Discharge Information Items Discharge Condition: Stable Ambulatory Status: Ambulatory Discharge Destination: Home Transportation: Private Auto Accompanied By: self Schedule Follow-up Appointment: Yes Clinical Summary of Care: Electronic Signature(s) Signed: 07/08/2023 4:52:03 PM By: Betha Loa Entered By: Betha Loa on 07/08/2023 12:23:00 -------------------------------------------------------------------------------- Lower Extremity Assessment Details Patient Name: Date of Service: [name] 07/08/2023 11:00 A M Medical Record Number: [id number] Patient Account Number: [id number] Date of Birth/Sex: Treating RN: Apr 04, 2006 (17 y.o. [name]) Yevonne Pax Primary Care Yizel Canby: PA Fidela Juneau Other Clinician: Betha Loa Referring Valina Maes: Treating Beulah Capobianco/Extender: Elizebeth Brooking, KRISTA Weeks in Treatment: 3 Electronic  Signature(s) Signed: 07/08/2023 4:42:48 PM By: Yevonne Pax RN Signed: 07/08/2023 4:52:03 PM By: Betha Loa Entered By: Betha Loa on 07/08/2023 11:36:55 -------------------------------------------------------------------------------- Multi Wound Chart Details Patient Name: Date of Service: Morton Amy IO Avery 07/08/2023 11:00 A M Medical Record Number: [id number] Patient Account Number: [id number] Date of Birth/Sex: Treating RN: March 30, 2006 (17 y.o. [name] Primary Care Teodor Prater: PA Zenovia Jordan, NO Other Clinician: Betha Loa Referring [place]: Treating Elin Seats/Extender: Elizebeth Brooking, KRISTA Weeks in Treatment: 3 Vital Signs Height(in): 67 Pulse(bpm): 69 Weight(lbs): 130 Blood Pressure(mmHg): 111/73 Body Mass Index(BMI): 20.4 Temperature(F): 97.3 Respiratory Rate(breaths/min): 18 [place], Cleophas Dunker ([id number]) [id number] Page 4 of 9 [2:Photos:] [N/A:N/A] Right Calcaneus N/A N/A Wound Location: Pressure Injury N/A N/A Wounding Event: Pressure Ulcer N/A N/A Primary Etiology: 06/16/2022 N/A N/A Date Acquired: 3 N/A N/A Weeks of Treatment: Open N/A N/A Wound Status: No N/A N/A Wound Recurrence: 0.7x0.8x0.2 N/A N/A Measurements L x W x D (cm) 0.44 N/A N/A A (cm) : rea 0.088 N/A N/A Volume (cm) : 46.70% N/A N/A % Reduction in A rea: 82.20% N/A N/A % Reduction in Volume: Category/Stage III N/A N/A Classification: Medium N/A N/A Exudate A mount: Sanguinous N/A N/A Exudate Type: red N/A N/A Exudate Color: Small (1-33%) N/A N/A Granulation A mount: Red, Pink N/A N/A Granulation Quality: Medium (34-66%) N/A N/A Necrotic A mount: Fat Layer (Subcutaneous Tissue): Yes N/A N/A Exposed Structures: Fascia: No Tendon: No Muscle: No Joint: No Bone: No None N/A N/A Epithelialization: Treatment Notes Electronic Signature(s) Signed: 07/08/2023 4:52:03 PM By: Betha Loa Entered By: Betha Loa on 07/08/2023  11:56:09 -------------------------------------------------------------------------------- Multi-Disciplinary Care Plan Details Patient Name: Date of Service: [name] 07/08/2023 11:00 A M Medical Record Number: [id number] Patient Account Number: [id number] Date of Birth/Sex: Treating RN: [birth year] (17 y.o. [name] Primary Care Marybella Ethier: PA Fidela Juneau Other Clinician: Betha Loa Referring Kasy Iannacone: Treating Espyn Radwan/Extender: Elizebeth Brooking, KRISTA Weeks in Treatment: 3 Active Inactive Necrotic Tissue Nursing Diagnoses: Impaired tissue integrity related to necrotic/devitalized tissue Knowledge deficit related to management of necrotic/devitalized tissue [name] ([id number]) [id number] Page 5 of 9 Goals: Necrotic/devitalized tissue will be minimized in the wound bed Date  Initiated: 06/18/2023 Target Resolution Date: 07/18/2023 Goal Status: Active Patient/caregiver will verbalize understanding of reason and process for debridement of necrotic tissue Date Initiated: 06/18/2023 Target Resolution Date: 07/18/2023 Goal Status: Active Interventions: Assess patient pain level pre-, during and post procedure and prior to discharge Provide education on necrotic tissue and debridement process Treatment Activities: Excisional debridement : 06/17/2023 Notes: Pressure Nursing Diagnoses: Knowledge deficit related to causes and risk factors for pressure ulcer development Knowledge deficit related to management of pressures ulcers Potential for impaired tissue integrity related to pressure, friction, moisture, and shear Goals: Patient will remain free from development of additional pressure ulcers Date Initiated: 06/18/2023 Target Resolution Date: 07/18/2023 Goal Status: Active Patient will remain free of pressure ulcers Date Initiated: 06/18/2023 Target Resolution Date: 07/18/2023 Goal Status: Active Patient/caregiver will  verbalize risk factors for pressure ulcer development Date Initiated: 06/18/2023 Target Resolution Date: 07/11/2023 Goal Status: Active Patient/caregiver will verbalize understanding of pressure ulcer management Date Initiated: 06/18/2023 Target Resolution Date: 06/17/2023 Goal Status: Active Interventions: Assess offloading mechanisms upon admission and as needed Assess potential for pressure ulcer upon admission and as needed Provide education on pressure ulcers Notes: Wound/Skin Impairment Nursing Diagnoses: Impaired tissue integrity Knowledge deficit related to ulceration/compromised skin integrity Goals: Patient/caregiver will verbalize understanding of skin care regimen Date Initiated: 06/18/2023 Target Resolution Date: 07/18/2023 Goal Status: Active Ulcer/skin breakdown will have a volume reduction of 30% by week 4 Date Initiated: 06/18/2023 Target Resolution Date: 07/18/2023 Goal Status: Active Ulcer/skin breakdown will have a volume reduction of 50% by week 8 Date Initiated: 06/18/2023 Target Resolution Date: 08/17/2023 Goal Status: Active Ulcer/skin breakdown will have a volume reduction of 80% by week 12 Date Initiated: 06/18/2023 Target Resolution Date: 09/17/2023 Goal Status: Active Ulcer/skin breakdown will heal within 14 weeks Date Initiated: 06/18/2023 Target Resolution Date: 10/01/2023 Goal Status: Active Interventions: Assess patient/caregiver ability to obtain necessary supplies Assess patient/caregiver ability to perform ulcer/skin care regimen upon admission and as needed Assess ulceration(s) every visit Provide education on ulcer and skin care Treatment Activities: Skin care regimen initiated : 06/17/2023 [name] ([id number]) [phone].pdf Page 6 of 9 Notes: Electronic Signature(s) Signed: 07/08/2023 4:42:48 PM By: Yevonne Pax RN Signed: 07/08/2023 4:52:03 PM By: Betha Loa Entered By: Betha Loa on  07/08/2023 12:00:02 -------------------------------------------------------------------------------- Pain Assessment Details Patient Name: Date of Service: [name] 07/08/2023 11:00 A M Medical Record Number: [id number] Patient Account Number: [id number] Date of Birth/Sex: Treating RN: [birth year] (17 y.o. [name] Primary Care Micky Overturf: PA Zenovia Jordan, NO Other Clinician: Betha Loa Referring Clearence Vitug: Treating [place]/Extender: Elizebeth Brooking, KRISTA Weeks in Treatment: 3 Active Problems Location of Pain Severity and Description of Pain Patient Has Paino No Site Locations Pain Management and Medication Current Pain Management: Electronic Signature(s) Signed: 07/08/2023 4:42:48 PM By: Yevonne Pax RN Signed: 07/08/2023 4:52:03 PM By: Betha Loa Entered By: Betha Loa on 07/08/2023 11:25:00 [name] ([id number]) [id number] Page 7 of 9 -------------------------------------------------------------------------------- Patient/Caregiver Education Details Patient Name: Date of Service: [name] 11/13/2024andnbsp11:00 A M Medical Record Number: [id number] Patient Account Number: [id number] Date of Birth/Gender: Treating RN: 2005/11/15 (17 y.o. [name]) Yevonne Pax Primary Care Physician: PA Zenovia Jordan, [place] Other Clinician: Betha Loa Referring Physician: Treating Physician/Extender: Barnabas Harries Weeks in Treatment: 3 Education Assessment Education Provided To: Patient Education Topics Provided Wound/Skin Impairment: Handouts: Other: continue wound care as directed Methods: Explain/Verbal Responses: State content correctly Electronic Signature(s) Signed: 07/08/2023 4:52:03 PM By: Betha Loa Entered By: Betha Loa on 07/08/2023  16:10:96 -------------------------------------------------------------------------------- Wound Assessment Details Patient Name: Date of Service: [name] 07/08/2023 11:00 A M Medical Record Number: [id number] Patient Account Number: [id number] Date of Birth/Sex: Treating RN: 07-Nov-2005 (17 y.o. [name]) Yevonne Pax Primary Care Keil Pickering: PA Fidela Juneau Other Clinician: Betha Loa Referring Tiernan Suto: Treating Ruhaan Nordahl/Extender: Elizebeth Brooking, KRISTA Weeks in Treatment: 3 Wound Status Wound Number: 2 Primary Etiology: Pressure Ulcer Wound Location: Right Calcaneus Wound Status: Open Wounding Event: Pressure Injury Date Acquired: 06/16/2022 Weeks Of Treatment: 3 Clustered Wound: No Photos [name] ([id number]) [id number] Page 8 of 9 Wound Measurements Length: (cm) 0.7 Width: (cm) 0.8 Depth: (cm) 0.2 Area: (cm) 0.44 Volume: (cm) 0.088 % Reduction in Area: 46.7% % Reduction in Volume: 82.2% Epithelialization: None Wound Description Classification: Category/Stage III Exudate Amount: Medium Exudate Type: Sanguinous Exudate Color: red Foul Odor After Cleansing: No Slough/Fibrino Yes Wound Bed Granulation Amount: Small (1-33%) Exposed Structure Granulation Quality: Red, Pink Fascia Exposed: No Necrotic Amount: Medium (34-66%) Fat Layer (Subcutaneous Tissue) Exposed: Yes Necrotic Quality: Adherent Slough Tendon Exposed: No Muscle Exposed: No Joint Exposed: No Bone Exposed: No Treatment Notes Wound #2 (Calcaneus) Wound Laterality: Right Cleanser Soap and Water Discharge Instruction: Gently cleanse wound with antibacterial soap, rinse and pat dry prior to dressing wounds Vashe 5.8 (oz) Discharge Instruction: Use vashe 5.8 (oz) as directed Peri-Wound Care Topical Primary Dressing Promogran Matrix 4.34 (in) Discharge Instruction: Moisten w/normal saline or sterile water; Cover wound as directed. Do not remove from wound bed. Secondary Dressing ABD Pad 5x9 (in/in) Discharge Instruction: Cover with ABD pad Heel Cup Secured With Kerlix Roll Sterile or Non-Sterile 6-ply 4.5x4  (yd/yd) Discharge Instruction: Apply Kerlix as directed Compression Wrap Compression Stockings Add-Ons Electronic Signature(s) Signed: 07/08/2023 4:42:48 PM By: Yevonne Pax RN Signed: 07/08/2023 4:52:03 PM By: Betha Loa Entered By: Betha Loa on 07/08/2023 11:36:47 [name] ([id number]) [id number] Page 9 of 9 -------------------------------------------------------------------------------- Vitals Details Patient Name: Date of Service: [name] 07/08/2023 11:00 A M Medical Record Number: [id number] Patient Account Number: [id number] Date of Birth/Sex: Treating RN: [birth year] (17 y.o. [name]) Yevonne Pax Primary Care Kionte Baumgardner: PA Zenovia Jordan, NO Other Clinician: Betha Loa Referring Nuri Larmer: Treating Tabatha Razzano/Extender: Elizebeth Brooking, KRISTA Weeks in Treatment: 3 Vital Signs Time Taken: 11:23 Temperature (F): 97.3 Height (in): 67 Pulse (bpm): 69 Weight (lbs): 130 Respiratory Rate (breaths/min): 18 Body Mass Index (BMI): 20.4 Blood Pressure (mmHg): 111/73 Reference Range: 80 - 120 mg / dl Electronic Signature(s) Signed: 07/08/2023 4:52:03 PM By: Betha Loa Entered By: Betha Loa on 07/08/2023 11:24:56

## 2023-07-08 NOTE — Progress Notes (Signed)
[name] ([id number]) [id number] Page 1 of 9 Visit Report for 07/01/2023 Arrival Information Details Patient Name: Date of Service: [name] 07/01/2023 1:00 PM Medical Record Number: [id number] Patient Account Number: [id number] Date of Birth/Sex: Treating RN: [birth year] (17 y.o. [name]) Yevonne Pax Primary Care [name]: PA [name], NO Other Clinician: Betha Avery Referring [name]: Treating [name]: [name] in Treatment: 2 Visit Information History Since Last Visit All ordered tests and consults were completed: No Patient Arrived: Ambulatory Added or deleted any medications: No Arrival Time: 13:04 Any Avery allergies or adverse reactions: No Transfer Assistance: None Had a fall or experienced change in No Patient Identification Verified: Yes activities of daily living that may affect Secondary Verification Process Completed: Yes risk of falls: Patient Requires Transmission-Based Precautions: No Signs or symptoms of abuse/neglect since last visito No Patient Has Alerts: No Hospitalized since last visit: No Implantable device outside of the clinic excluding No cellular tissue based products placed in the center since last visit: Has Dressing in Place as Prescribed: Yes Has Footwear/Offloading in Place as Prescribed: Yes Right: Wedge Shoe Pain Present Now: No Electronic Signature(s) Signed: 07/01/2023 5:39:21 PM By: [name] Entered By: [name] on 07/01/2023 13:10:43 -------------------------------------------------------------------------------- Clinic Level of Care Assessment Details Patient Name: Date of Service: [name] 07/01/2023 1:00 PM Medical Record Number: [id number] Patient Account Number: [id number] Date of Birth/Sex: Treating RN: November 13, 2005 (17 y.o. [name]) Yevonne Pax Primary Care Garhett Bernhard: PA [name], NO Other Clinician: Betha Avery Referring [name]: Treating  [name]/Extender: [name], [name] in Treatment: 2 Clinic Level of Care Assessment Items TOOL 1 Quantity Score []  - 0 Use when EandM and Procedure is performed on INITIAL visit ASSESSMENTS - Nursing Assessment / Reassessment []  - 0 General Physical Exam (combine w/ comprehensive assessment (listed just below) when performed on Avery pt. [address] ([id number]) [id number] Page 2 of 9 []  - 0 Comprehensive Assessment (HX, ROS, Risk Assessments, Wounds Hx, etc.) ASSESSMENTS - Wound and Skin Assessment / Reassessment []  - 0 Dermatologic / Skin Assessment (not related to wound area) ASSESSMENTS - Ostomy and/or Continence Assessment and Care []  - 0 Incontinence Assessment and Management []  - 0 Ostomy Care Assessment and Management (repouching, etc.) PROCESS - Coordination of Care []  - 0 Simple Patient / Family Education for ongoing care []  - 0 Complex (extensive) Patient / Family Education for ongoing care []  - 0 Staff obtains Chiropractor, Records, T Results / Process Orders est []  - 0 Staff telephones HHA, Nursing Homes / Clarify orders / etc []  - 0 Routine Transfer to another Facility (non-emergent condition) []  - 0 Routine Hospital Admission (non-emergent condition) []  - 0 Avery Admissions / Manufacturing engineer / Ordering NPWT Apligraf, etc. , []  - 0 Emergency Hospital Admission (emergent condition) PROCESS - Special Needs []  - 0 Pediatric / Minor Patient Management []  - 0 Isolation Patient Management []  - 0 Hearing / Language / Visual special needs []  - 0 Assessment of Community assistance (transportation, D/C planning, etc.) []  - 0 Additional assistance / Altered mentation []  - 0 Support Surface(s) Assessment (bed, cushion, seat, etc.) INTERVENTIONS - Miscellaneous []  - 0 External ear exam []  - 0 Patient Transfer (multiple staff / Nurse, adult / Similar devices) []  - 0 Simple Staple / Suture removal (25 or  less) []  - 0 Complex Staple / Suture removal (26 or more) []  - 0 Hypo/Hyperglycemic Management (do not check if billed separately) []  - 0 Ankle /  Brachial Index (ABI) - do not check if billed separately Has the patient been seen at the hospital within the last three years: Yes Total Score: 0 Level Of Care: ____ Electronic Signature(s) Signed: 07/01/2023 5:39:21 PM By: [name] Entered By: [name] on 07/01/2023 13:41:20 -------------------------------------------------------------------------------- Encounter Discharge Information Details Patient Name: Date of Service: [name] 07/01/2023 1:00 PM Medical Record Number: [id number] Patient Account Number: [id number] Date of Birth/Sex: Treating RN: Apr 16, 2006 ([address] y.o. [name], [name] ([id number]) [id number] Page 3 of 9 Primary Care Janicia Monterrosa: PA [name], NO Other Clinician: Betha Avery Referring [name]: Treating [name]/Extender: [name], [name] in Treatment: 2 Encounter Discharge Information Items Post Procedure Vitals Discharge Condition: Stable Temperature (F): 98.4 Ambulatory Status: Ambulatory Pulse (bpm): 59 Discharge Destination: Home Respiratory Rate (breaths/min): 16 Transportation: Other Blood Pressure (mmHg): 113/70 Accompanied By: self Schedule Follow-up Appointment: Yes Clinical Summary of Care: Electronic Signature(s) Signed: 07/01/2023 5:39:21 PM By: [name] Entered By: [name] on 07/01/2023 13:42:43 -------------------------------------------------------------------------------- Lower Extremity Assessment Details Patient Name: Date of Service: [name] 07/01/2023 1:00 PM Medical Record Number: [id number] Patient Account Number: [id number] Date of Birth/Sex: Treating RN: 26-Nov-2005 (17 y.o. [name]) Yevonne Pax Primary Care [name]: PA [name], NO Other Clinician: Betha Avery Referring Lilianne Delair: Treating  [name]/Extender: [name], [name] in Treatment: 2 Electronic Signature(s) Signed: 07/01/2023 5:39:21 PM By: [name] Signed: 07/08/2023 4:43:41 PM By: Yevonne Pax RN Entered By: [name] on 07/01/2023 13:20:27 -------------------------------------------------------------------------------- Multi Wound Chart Details Patient Name: Date of Service: Morton Amy IO Avery 07/01/2023 1:00 PM Medical Record Number: [id number] Patient Account Number: [id number] Date of Birth/Sex: Treating RN: 2005/11/13 (17 y.o. Kevin [place] Primary Care Inola Lisle: PA [name], NO Other Clinician: Betha Avery Referring Shanoah Asbill: Treating Debora Stockdale/Extender: [name], [name] in Treatment: 2 Vital Signs Height(in): 67 Pulse(bpm): 59 Weight(lbs): 130 Blood Pressure(mmHg): 113/70 Body Mass Index(BMI): 20.4 Temperature(F): 98.4 Respiratory Rate(breaths/min): 16 Rivere, [name] ([id number]) [id number] Page 4 of 9 [2:Photos:] [N/A:N/A] Right Calcaneus N/A N/A Wound Location: Pressure Injury N/A N/A Wounding Event: Pressure Ulcer N/A N/A Primary Etiology: 06/16/2022 N/A N/A Date Acquired: 2 N/A N/A Avery of Treatment: Open N/A N/A Wound Status: No N/A N/A Wound Recurrence: 0.4x0.7x0.5 N/A N/A Measurements L x W x D (cm) 0.22 N/A N/A A (cm) : rea 0.11 N/A N/A Volume (cm) : 73.30% N/A N/A % Reduction in A rea: 77.80% N/A N/A % Reduction in Volume: Category/Stage III N/A N/A Classification: Medium N/A N/A Exudate A mount: Sanguinous N/A N/A Exudate Type: red N/A N/A Exudate Color: Small (1-33%) N/A N/A Granulation A mount: Red, Pink N/A N/A Granulation Quality: Medium (34-66%) N/A N/A Necrotic A mount: Fat Layer (Subcutaneous Tissue): Yes N/A N/A Exposed Structures: Fascia: No Tendon: No Muscle: No Joint: No Bone: No None N/A N/A Epithelialization: Treatment Notes Electronic Signature(s) Signed:  07/01/2023 5:39:21 PM By: [name] Entered By: [name] on 07/01/2023 13:20:33 -------------------------------------------------------------------------------- Multi-Disciplinary Care Plan Details Patient Name: Date of Service: [name] 07/01/2023 1:00 PM Medical Record Number: [id number] Patient Account Number: [id number] Date of Birth/Sex: Treating RN: [birth year] (17 y.o. Kevin [place] Primary Care Mallissa Lorenzen: PA Fidela Juneau Other Clinician: Betha Avery Referring Merric Yost: Treating Welcome Fults/Extender: [name], [name] in Treatment: 2 Active Inactive Necrotic Tissue Nursing Diagnoses: Impaired tissue integrity related to necrotic/devitalized tissue Knowledge deficit related to management of necrotic/devitalized tissue [name] ([id number]) [id number] Page 5 of 9 Goals: Necrotic/devitalized  tissue will be minimized in the wound bed Date Initiated: 06/18/2023 Target Resolution Date: 07/18/2023 Goal Status: Active Patient/caregiver will verbalize understanding of reason and process for debridement of necrotic tissue Date Initiated: 06/18/2023 Target Resolution Date: 07/18/2023 Goal Status: Active Interventions: Assess patient pain level pre-, during and post procedure and prior to discharge Provide education on necrotic tissue and debridement process Treatment Activities: Excisional debridement : 06/17/2023 Notes: Pressure Nursing Diagnoses: Knowledge deficit related to causes and risk factors for pressure ulcer development Knowledge deficit related to management of pressures ulcers Potential for impaired tissue integrity related to pressure, friction, moisture, and shear Goals: Patient will remain free from development of additional pressure ulcers Date Initiated: 06/18/2023 Target Resolution Date: 07/18/2023 Goal Status: Active Patient will remain free of pressure ulcers Date Initiated:  06/18/2023 Target Resolution Date: 07/18/2023 Goal Status: Active Patient/caregiver will verbalize risk factors for pressure ulcer development Date Initiated: 06/18/2023 Target Resolution Date: 07/11/2023 Goal Status: Active Patient/caregiver will verbalize understanding of pressure ulcer management Date Initiated: 06/18/2023 Target Resolution Date: 06/17/2023 Goal Status: Active Interventions: Assess offloading mechanisms upon admission and as needed Assess potential for pressure ulcer upon admission and as needed Provide education on pressure ulcers Notes: Wound/Skin Impairment Nursing Diagnoses: Impaired tissue integrity Knowledge deficit related to ulceration/compromised skin integrity Goals: Patient/caregiver will verbalize understanding of skin care regimen Date Initiated: 06/18/2023 Target Resolution Date: 07/18/2023 Goal Status: Active Ulcer/skin breakdown will have a volume reduction of 30% by week 4 Date Initiated: 06/18/2023 Target Resolution Date: 07/18/2023 Goal Status: Active Ulcer/skin breakdown will have a volume reduction of 50% by week 8 Date Initiated: 06/18/2023 Target Resolution Date: 08/17/2023 Goal Status: Active Ulcer/skin breakdown will have a volume reduction of 80% by week 12 Date Initiated: 06/18/2023 Target Resolution Date: 09/17/2023 Goal Status: Active Ulcer/skin breakdown will heal within 14 Avery Date Initiated: 06/18/2023 Target Resolution Date: 10/01/2023 Goal Status: Active Interventions: Assess patient/caregiver ability to obtain necessary supplies Assess patient/caregiver ability to perform ulcer/skin care regimen upon admission and as needed Assess ulceration(s) every visit Provide education on ulcer and skin care Treatment Activities: Skin care regimen initiated : 06/17/2023 [name] ([id number]) [id number] Page 6 of 9 Notes: Electronic Signature(s) Signed: 07/01/2023 5:39:21 PM By: [name] Signed: 07/08/2023 4:43:41 PM By: Yevonne Pax RN Entered By: [name] on 07/01/2023 13:41:31 -------------------------------------------------------------------------------- Pain Assessment Details Patient Name: Date of Service: [name] 07/01/2023 1:00 PM Medical Record Number: [id number] Patient Account Number: [id number] Date of Birth/Sex: Treating RN: 2006-05-21 (17 y.o. Kevin [place] Primary Care Brandelyn Henne: PA [name], NO Other Clinician: Betha Avery Referring Burel Kahre: Treating Colon Rueth/Extender: [name], [name] in Treatment: 2 Active Problems Location of Pain Severity and Description of Pain Patient Has Paino No Site Locations Pain Management and Medication Current Pain Management: Electronic Signature(s) Signed: 07/01/2023 5:39:21 PM By: [name] Signed: 07/08/2023 4:43:41 PM By: Yevonne Pax RN Entered By: [name] on 07/01/2023 13:12:38 [name] ([id number]) [id number] Page 7 of 9 -------------------------------------------------------------------------------- Patient/Caregiver Education Details Patient Name: Date of Service: [name] 11/6/2024andnbsp1:00 PM Medical Record Number: [id number] Patient Account Number: [id number] Date of Birth/Gender: Treating RN: 2005-10-01 (17 y.o. [name]) Yevonne Pax Primary Care Physician: PA [name], [place] Other Clinician: Betha Avery Referring Physician: Treating Physician/Extender: [name] in Treatment: 2 Education Assessment Education Provided To: Patient Education Topics Provided Wound/Skin Impairment: Handouts: Other: continue wound care as directed Methods: Explain/Verbal Electronic Signature(s) Signed: 07/01/2023 5:39:21 PM By: [name] Entered By: Glynda Jaeger,  Angie on 07/01/2023 13:41:54 -------------------------------------------------------------------------------- Wound Assessment  Details Patient Name: Date of Service: [name] 07/01/2023 1:00 PM Medical Record Number: [id number] Patient Account Number: [id number] Date of Birth/Sex: Treating RN: 2006/07/29 (17 y.o. [name]) Yevonne Pax Primary Care Adalyna Godbee: PA [name], NO Other Clinician: Betha Avery Referring Leena Tiede: Treating Ayriel Texidor/Extender: [name], [name] in Treatment: 2 Wound Status Wound Number: 2 Primary Etiology: Pressure Ulcer Wound Location: Right Calcaneus Wound Status: Open Wounding Event: Pressure Injury Date Acquired: 06/16/2022 Avery Of Treatment: 2 Clustered Wound: No Photos [name] ([id number]) [id number] Page 8 of 9 Wound Measurements Length: (cm) 0.4 Width: (cm) 0.7 Depth: (cm) 0.5 Area: (cm) 0.22 Volume: (cm) 0.11 % Reduction in Area: 73.3% % Reduction in Volume: 77.8% Epithelialization: None Wound Description Classification: Category/Stage III Exudate Amount: Medium Exudate Type: Sanguinous Exudate Color: red Foul Odor After Cleansing: No Slough/Fibrino Yes Wound Bed Granulation Amount: Small (1-33%) Exposed Structure Granulation Quality: Red, Pink Fascia Exposed: No Necrotic Amount: Medium (34-66%) Fat Layer (Subcutaneous Tissue) Exposed: Yes Necrotic Quality: Adherent Slough Tendon Exposed: No Muscle Exposed: No Joint Exposed: No Bone Exposed: No Electronic Signature(s) Signed: 07/01/2023 5:39:21 PM By: [name] Signed: 07/08/2023 4:43:41 PM By: Yevonne Pax RN Entered By: [name] on 07/01/2023 13:20:16 -------------------------------------------------------------------------------- Vitals Details Patient Name: Date of Service: [name] 07/01/2023 1:00 PM Medical Record Number: [id number] Patient Account Number: [id number] Date of Birth/Sex: Treating RN: Aug 20, 2006 (17 y.o. [name]) Yevonne Pax Primary Care Veva Grimley: PA [name], NO Other Clinician: Betha Avery Referring  Natiya Seelinger: Treating Stephanee Barcomb/Extender: [name], [name] in Treatment: 2 Vital Signs Time Taken: 13:11 Temperature (F): 98.4 Height (in): 67 Pulse (bpm): 59 Weight (lbs): 130 Respiratory Rate (breaths/min): 16 Body Mass Index (BMI): 20.4 Blood Pressure (mmHg): 113/70 Reference Range: 80 - 120 mg / dl Electronic Signature(s) Signed: 07/01/2023 5:39:21 PM By: Alben Deeds ([id number]) [id number] Page 9 of 9 Entered By: [name] on 07/01/2023 13:12:33

## 2023-07-15 ENCOUNTER — Ambulatory Visit: Payer: Medicaid Other | Admitting: Physician Assistant

## 2023-07-16 ENCOUNTER — Encounter: Payer: Medicaid Other | Admitting: Physician Assistant

## 2023-07-16 DIAGNOSIS — L89613 Pressure ulcer of right heel, stage 3: Secondary | ICD-10-CM | POA: Diagnosis not present

## 2023-07-16 NOTE — Progress Notes (Addendum)
[name] ([id number]) [id number] Page 1 of 8 Visit Report for 07/16/2023 Chief Complaint Document Details Patient Name: Date of Service: [name] 07/16/2023 12:45 PM Medical Record Number: [id number] Patient Account Number: [id number] Date of Birth/Sex: Treating RN: 2006/07/04 (17 y.o. [name] Primary Care Provider: PA Zenovia Jordan, NO Other Clinician: Betha Loa Referring Provider: Treating Provider/Extender: Barnabas Harries Weeks in Treatment: 4 Information Obtained from: Patient Chief Complaint Pressure ulcer left foot due to brace 06/17/2023; patient is here for review of a pressure area on the right plantar heel Electronic Signature(s) Signed: 07/16/2023 12:46:10 PM By: Allen Derry PA-C Entered By: Allen Derry on 07/16/2023 09:46:10 -------------------------------------------------------------------------------- Debridement Details Patient Name: Date of Service: [name] 07/16/2023 12:45 PM Medical Record Number: [id number] Patient Account Number: [id number] Date of Birth/Sex: Treating RN: 2005-09-17 (17 y.o. [name] Primary Care Provider: PA Zenovia Jordan, NO Other Clinician: Betha Loa Referring Provider: Treating Provider/Extender: Barnabas Harries Weeks in Treatment: 4 Debridement Performed for Assessment: Wound #2 Right Calcaneus Performed By: Physician Allen Derry, PA-C The following information was scribed by: Betha Loa The information was scribed for: Allen Derry Debridement Type: Debridement Level of Consciousness (Pre-procedure): Awake and Alert Pre-procedure Verification/Time Out Yes - 13:14 Taken: Start Time: 13:14 Percent of Wound Bed Debrided: 100% T Area Debrided (cm): otal 0.44 Tissue and other material debrided: Viable, Non-Viable, Callus, Slough, Subcutaneous, Slough Level: Skin/Subcutaneous Tissue Debridement Description: Excisional Instrument:  Curette Bleeding: Minimum Hemostasis Achieved: Pressure Response to Treatment: Procedure was tolerated well [name] ([id number]) [id number] Page 2 of 8 Level of Consciousness (Post- Awake and Alert procedure): Post Debridement Measurements of Total Wound Length: (cm) 0.7 Stage: Category/Stage III Width: (cm) 0.8 Depth: (cm) 0.3 Volume: (cm) 0.132 Character of Wound/Ulcer Post Debridement: Stable Post Procedure Diagnosis Same as Pre-procedure Electronic Signature(s) Signed: 07/16/2023 4:35:52 PM By: Angelina Pih Signed: 07/16/2023 4:53:18 PM By: Betha Loa Signed: 07/16/2023 6:01:43 PM By: Allen Derry PA-C Entered By: Betha Loa on 07/16/2023 10:59:08 -------------------------------------------------------------------------------- HPI Details Patient Name: Date of Service: [name] 07/16/2023 12:45 PM Medical Record Number: [id number] Patient Account Number: [id number] Date of Birth/Sex: Treating RN: 01/24/2006 (17 y.o. [name] Primary Care Provider: PA Zenovia Jordan, NO Other Clinician: Betha Loa Referring Provider: Treating Provider/Extender: Barnabas Harries Weeks in Treatment: 4 History of Present Illness HPI Description: 01/20/2020 upon evaluation today patient presents for initial inspection here in our clinic concerning issues that he has been having with his left foot laterally at the fifth metatarsal location. It appears that he has issues unfortunately with chronic rubbing on his brace. He does have spina bifida and he has issues with this fairly frequently. With that being said the good news is this does not appear to be infected. The bad news is it is a recurrent issue. He is can be having surgery for foot reconstruction June 8. 01/27/2020 on evaluation today patient appears to be doing about the same in regard to his wound at this point. Fortunately there is no signs of active  infection. Overall I feel like he is actually managing quite well. The wound is very macerated I think this is simply due to the fact that he has been using a Band-Aid as opposed to an absorptive bandage over top of the collagen. He really has nothing to drain into and then been putting over top of this Covan which is occluding it even more. 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 He wears an AFO brace on both feet. Noteworthy that he was here in 2021 for an area on the left foot He tells me that he has had a wound on the plantar right heel for the better part of a year. Prior to this this may have opened and closed repetitively. He has not been specifically dressing this and he became alarmed when it would not close and also that there may have been an odor noticeable. He is here for a review of this. 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 ABI in our clinic was 1.22 on the right 10/30; x-ray of the right heel was normal no osseous destruction. His PCR culture that I did last week showed E. coli various anaerobes and MSSA. We have been using Hydrofera Blue and a heel offloading boot 07-01-2023 upon evaluation today patient appears to be doing decently well currently in regard to his wound. Fortunately there does not appear to be any signs of active infection at this time. This is a gentleman whom I have seen previously about 3 years ago. He actually is doing decently well at this point he did have some debridement last week with Dr. Leanord Hawking he said to have a cast today he will be come back on Friday to change this  out. 07-03-2023 upon evaluation today patient appears to be doing well currently in regard to his wound. He has been tolerating the dressing changes without complication. Fortunately there does not appear to be any signs of infection which is good news I think the cast that a great job. He is here for the first cast change to ensure nothing was rubbing the medial part of his ankle was the only place in question he thinks a bigger boot could potentially benefit him there. [name] ([id number]) [id number] Page 3 of 8 07-08-2023 patient's wound currently is showing signs of good improvement I am actually very pleased with where we stand is already measuring smaller and looking better I feel that the depth is filling in and I am very pleased in that regard. I do not see any signs of infection the cast seems to be doing well with this and make sure to pad his ankle area to make sure that nothing is rubbing in this region. 07-16-2023 upon evaluation today patient's heel ulcer has not made quite as much improvement as I like to see although a lot of the callus has loosened up around the edges of the wound has become obvious that I can clear away a lot more this callus and what I previously was able to with the being softer. For that reason I did go ahead and at this point perform debridement clearway necrotic debris and the patient tolerated this today without complication. Postdebridement the wound bed actually appears to be doing much better. Electronic Signature(s) Signed: 07/16/2023 5:42:15 PM By: Allen Derry PA-C Entered By: Allen Derry on 07/16/2023 14:42:15 -------------------------------------------------------------------------------- Physical Exam Details Patient Name: Date of Service: [name] 07/16/2023 12:45 PM Medical Record Number: [id number] Patient Account Number: [id number] Date of Birth/Sex: Treating RN: [birth year] (17 y.o. [name],  Caitlin Primary Care Provider: PA Zenovia Jordan, [place] Other Clinician: Betha Loa Referring Provider: Treating Provider/Extender: Elizebeth Brooking, KRISTA Weeks in Treatment: 4 Constitutional Well-nourished and well-hydrated in no acute distress. Respiratory normal breathing without difficulty. Psychiatric this patient is able to make decisions and demonstrates good insight into disease process. Alert and Oriented x 3. pleasant and cooperative. Notes Patient's wound bed did require sharp debridement clearway necrotic debris he tolerated the debridement today without complication and postdebridement the wound bed is significantly improved. Electronic Signature(s) Signed: 07/16/2023 5:42:31 PM By: Allen Derry PA-C Entered By: Allen Derry on 07/16/2023 14:42:30 -------------------------------------------------------------------------------- Physician Orders Details Patient Name: Date of Service: [name] 07/16/2023 12:45 PM Medical Record Number: [id number] Patient Account Number: [id number] Date of Birth/Sex: Treating RN: [birth year] (17 y.o. 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 Bathing/ Shower/ Hygiene Clean wound with Normal Saline or wound cleanser. Anesthetic (Use 'Patient Medications' Section for Anesthetic Order Entry) Lidocaine applied to wound bed Off-Loading Total Contact Cast to Right Lower Extremity Wound  Treatment Wound #2 - Calcaneus Wound Laterality: Right Cleanser: Soap and Water 3 x Per Week/30 Days Discharge Instructions: Gently cleanse wound with antibacterial soap, rinse and pat dry prior to dressing wounds Cleanser: Vashe 5.8 (oz) 3 x Per Week/30 Days Discharge Instructions: Use vashe 5.8 (oz) as directed Topical: Gentamicin 3 x Per Week/30 Days Discharge Instructions: Apply as directed by provider. Prim Dressing: Hydrofera Blue Ready Transfer Foam, 2.5x2.5 (in/in) 3 x Per Week/30 Days ary Discharge Instructions: Apply Hydrofera Blue Ready to wound bed as directed Secondary Dressing: Zetuvit Plus 4x4 (in/in) 3 x Per Week/30 Days Secondary Dressing: Heel Cup 3 x Per Week/30 Days Secured With: [employer] Sterile or Non-Sterile 6-ply 4.5x4 (yd/yd) 3 x Per Week/30 Days Discharge Instructions: Apply Kerlix as directed Electronic Signature(s) Signed: 07/16/2023 4:53:18 PM By: Betha Loa Signed: 07/16/2023 6:01:43 PM By: Allen Derry PA-C Entered By: Betha Loa on 07/16/2023 10:21:42 -------------------------------------------------------------------------------- Problem List Details Patient Name: Date of Service: [name] 07/16/2023 12:45 PM Medical Record Number: [id number] Patient Account Number: [id number] Date of Birth/Sex: Treating RN: Oct 21, 2005 (17 y.o. 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 [name] Primary Care Provider: PA Zenovia Jordan, [place] Other Clinician: Betha Loa Referring Provider: Treating Provider/Extender: Barnabas Harries Weeks in Treatment: 4 Subjective Chief Complaint Information obtained from Patient Pressure ulcer left foot due to brace 06/17/2023; patient  is here for review of a pressure area on the right plantar heel History of Present Illness (HPI) 01/20/2020 upon evaluation today patient presents for initial inspection here in our clinic concerning issues that he has been having with his left foot laterally at the fifth metatarsal location. It appears that he has issues unfortunately with chronic rubbing on his brace. He does have spina bifida and he has issues with this fairly frequently. With that being said the good news is this does not appear to be infected. The bad news is it is a recurrent issue. He is can be having surgery for foot reconstruction June 8. 01/27/2020 on evaluation today patient appears to be doing about the same in regard to his wound at this point. Fortunately there is no signs of active infection. Overall I feel like he is actually managing quite well. The wound is very macerated I think this is simply due to the fact that he has been using a Band-Aid as opposed to an absorptive bandage over top of the collagen. He really has nothing to drain into and then been putting over top of this Covan which is occluding it even more. 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 He wears an AFO brace on both feet. Noteworthy that he was here in 2021 for an area on the left foot He tells me that he has had a wound on the plantar right heel for the better part of a year. Prior to this this may have opened and closed repetitively. He has not been specifically dressing this and he became alarmed when it would not close and also that there may have been an odor noticeable. He is here for a review of this. 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 ABI in our clinic was 1.22 on the right 10/30; x-ray of the right heel was normal no osseous destruction. His PCR culture that I did last week showed E. coli various anaerobes and MSSA. We have been using Hydrofera Blue and a heel offloading boot 07-01-2023 upon evaluation today patient appears to be doing decently well currently in regard to his wound. Fortunately there does not appear to be any signs of active infection at this time. This is a gentleman whom I have seen previously about 3 years ago. He actually is doing decently well at this point he did KAMRAN, ROBSON ([id number]) [id number] Page 6 of 8 have some debridement last week with Dr. Leanord Hawking he said to have a cast today he will be come back on Friday to change this out. 07-03-2023 upon evaluation today patient appears to be doing well currently in regard to his wound. He has been tolerating the dressing changes without complication. Fortunately there does not appear to be any signs of infection which is good news I think the cast that a great job. He is here for the first cast change to ensure nothing was rubbing the medial part of his ankle was the only place in question he thinks a bigger boot could  potentially benefit him there. 07-08-2023 patient's wound currently is showing signs of good improvement I am actually very pleased with  where we stand is already measuring smaller and looking better I feel that the depth is filling in and I am very pleased in that regard. I do not see any signs of infection the cast seems to be doing well with this and make sure to pad his ankle area to make sure that nothing is rubbing in this region. 07-16-2023 upon evaluation today patient's heel ulcer has not made quite as much improvement as I like to see although a lot of the callus has loosened up around the edges of the wound has become obvious that I can clear away a lot more this callus and what I previously was able to with the being softer. For that reason I did go ahead and at this point perform debridement clearway necrotic debris and the patient tolerated this today without complication. Postdebridement the wound bed actually appears to be doing much better. Objective Constitutional Well-nourished and well-hydrated in no acute distress. Vitals Time Taken: 12:52 PM, Height: 67 in, Weight: 130 lbs, BMI: 20.4, Temperature: 98.3 F, Pulse: 74 bpm, Respiratory Rate: 16 breaths/min, Blood Pressure: 119/74 mmHg. Respiratory normal breathing without difficulty. Psychiatric this patient is able to make decisions and demonstrates good insight into disease process. Alert and Oriented x 3. pleasant and cooperative. General Notes: Patient's wound bed did require sharp debridement clearway necrotic debris he tolerated the debridement today without complication and postdebridement the wound bed is significantly improved. Integumentary (Hair, Skin) Wound #2 status is Open. Original cause of wound was Pressure Injury. The date acquired was: 06/16/2022. The wound has been in treatment 4 weeks. The wound is located on the Right Calcaneus. The wound measures 0.5cm length x 0.7cm width x 0.3cm depth; 0.275cm^2 area  and 0.082cm^3 volume. There is Fat Layer (Subcutaneous Tissue) exposed. There is a medium amount of sanguinous drainage noted. There is small (1-33%) red, pink granulation within the wound bed. There is a medium (34-66%) amount of necrotic tissue within the wound bed including Adherent Slough. Assessment Active Problems ICD-10 Pressure ulcer of right heel, stage 3 Lumbar spina bifida without hydrocephalus Procedures Wound #2 Pre-procedure diagnosis of Wound #2 is a Pressure Ulcer located on the Right Calcaneus . There was a Excisional Skin/Subcutaneous Tissue Debridement with a total area of 0.44 sq cm performed by Allen Derry, PA-C. With the following instrument(s): Curette to remove Viable and Non-Viable tissue/material. Material removed includes Callus, Subcutaneous Tissue, and Slough. A time out was conducted at 13:14, prior to the start of the procedure. A Minimum amount of bleeding was controlled with Pressure. The procedure was tolerated well. Post Debridement Measurements: 0.7cm length x 0.8cm width x 0.3cm depth; 0.132cm^3 volume. Post debridement Stage noted as Category/Stage III. Character of Wound/Ulcer Post Debridement is stable. Post procedure Diagnosis Wound #2: Same as Pre-Procedure Pre-procedure diagnosis of Wound #2 is a Pressure Ulcer located on the Right Calcaneus . There was a T Contact Cast Procedure by Allen Derry, PA-C. otal Post procedure Diagnosis Wound #2: Same as Pre-Procedure Plan Follow-up Appointments: [name], [name] ([id number]) [id number] Page 7 of 8 Return Appointment in 1 week. Bathing/ Shower/ Hygiene: Clean wound with Normal Saline or wound cleanser. Anesthetic (Use 'Patient Medications' Section for Anesthetic Order Entry): Lidocaine applied to wound bed Off-Loading: T Contact Cast to Right Lower Extremity otal WOUND #2: - Calcaneus Wound Laterality: Right Cleanser: Soap and Water 3 x Per Week/30 Days Discharge  Instructions: Gently cleanse wound with antibacterial soap, rinse and pat dry prior to dressing wounds Cleanser: Vashe 5.8 (oz) 3  x Per Week/30 Days Discharge Instructions: Use vashe 5.8 (oz) as directed Topical: Gentamicin 3 x Per Week/30 Days Discharge Instructions: Apply as directed by provider. Prim Dressing: Hydrofera Blue Ready Transfer Foam, 2.5x2.5 (in/in) 3 x Per Week/30 Days ary Discharge Instructions: Apply Hydrofera Blue Ready to wound bed as directed Secondary Dressing: Zetuvit Plus 4x4 (in/in) 3 x Per Week/30 Days Secondary Dressing: Heel Cup 3 x Per Week/30 Days Secured With: [employer] Sterile or Non-Sterile 6-ply 4.5x4 (yd/yd) 3 x Per Week/30 Days Discharge Instructions: Apply Kerlix as directed 1. I would recommend that we have the patient going continue to monitor for any evidence of infection or worsening. Based on what I see I do believe that we will making good headway here towards closure which is great news. 2. I would recommend as well that we have the patient continue with the [facility] I think this is doing a really good job as well. With 3 muscle can recommend the patient should continue with the Zetuvit and were also using the heel cup for padding around his ankles which has done well I do believe the casting is appropriate to continue with and I did reapply the total contact cast today. We will see patient back for reevaluation in 1 week here in the clinic. If anything worsens or changes patient will contact our office for additional recommendations. Electronic Signature(s) Signed: 07/16/2023 5:43:13 PM By: Allen Derry PA-C Entered By: Allen Derry on 07/16/2023 14:43:13 -------------------------------------------------------------------------------- Total Contact Cast Details Patient Name: Date of Service: [name] 07/16/2023 12:45 PM Medical Record Number: [id number] Patient Account Number: [id number] Date of Birth/Sex: Treating RN: [birth year]  (17 y.o. [name] Primary Care Provider: PA Zenovia Jordan, NO Other Clinician: Betha Loa Referring Provider: Treating Provider/Extender: Barnabas Harries Weeks in Treatment: 4 T Contact Cast Applied for Wound Assessment: otal Wound #2 Right Calcaneus Performed By: Physician Allen Derry, PA-C Post Procedure Diagnosis Same as Pre-procedure Electronic Signature(s) Signed: 07/16/2023 4:53:18 PM By: Betha Loa Signed: 07/16/2023 6:01:43 PM By: Allen Derry PA-C Entered By: Betha Loa on 07/16/2023 10:14:45 [name] ([id number]) [id number] Page 8 of 8 -------------------------------------------------------------------------------- SuperBill Details Patient Name: Date of Service: [name] 07/16/2023 Medical Record Number: [id number] Patient Account Number: [id number] Date of Birth/Sex: Treating RN: 2006/02/10 (17 y.o. 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

## 2023-07-16 NOTE — Progress Notes (Addendum)
[name] ([id number]) [id number] Page 1 of 9 Visit Report for 07/16/2023 Arrival Information Details Patient Name: Date of Service: [name] 07/16/2023 12:45 PM Medical Record Number: [id number] Patient Account Number: [id number] Date of Birth/Sex: Treating RN: 29-Sep-2005 (17 y.o. [name] Primary Care Sivan Cuello: PA Zenovia Jordan, NO Other Clinician: Betha Loa Referring Nargis Abrams: Treating Sherlyn Ebbert/Extender: Barnabas Harries Weeks in Treatment: 4 Visit Information History Since Last Visit All ordered tests and consults were completed: No Patient Arrived: Ambulatory Added or deleted any medications: No Arrival Time: 12:51 Any Avery allergies or adverse reactions: No Transfer Assistance: None Had a fall or experienced change in No Patient Identification Verified: Yes activities of daily living that may affect Secondary Verification Process Completed: Yes risk of falls: Patient Requires Transmission-Based Precautions: No Signs or symptoms of abuse/neglect since last visito No Patient Has Alerts: No Hospitalized since last visit: No Implantable device outside of the clinic excluding No cellular tissue based products placed in the center since last visit: Has Dressing in Place as Prescribed: Yes Has Footwear/Offloading in Place as Prescribed: Yes Right: T Contact Cast otal Pain Present Now: No Electronic Signature(s) Signed: 07/16/2023 4:53:18 PM By: Betha Loa Entered By: Betha Loa on 07/16/2023 12:52:28 -------------------------------------------------------------------------------- Clinic Level of Care Assessment Details Patient Name: Date of Service: [name] 07/16/2023 12:45 PM Medical Record Number: [id number] Patient Account Number: [id number] Date of Birth/Sex: Treating RN: Feb 12, 2006 (17 y.o. [name] Primary Care Elaysia Devargas: PA Zenovia Jordan, NO Other Clinician: Betha Loa Referring  Kayra Crowell: Treating Xee Hollman/Extender: Barnabas Harries Weeks in Treatment: 4 Clinic Level of Care Assessment Items TOOL 1 Quantity Score []  - 0 Use when EandM and Procedure is performed on INITIAL visit ASSESSMENTS - Nursing Assessment / Reassessment []  - 0 General Physical Exam (combine w/ comprehensive assessment (listed just below) when performed on Avery pt. [address] ([id number]) [id number] Page 2 of 9 []  - 0 Comprehensive Assessment (HX, ROS, Risk Assessments, Wounds Hx, etc.) ASSESSMENTS - Wound and Skin Assessment / Reassessment []  - 0 Dermatologic / Skin Assessment (not related to wound area) ASSESSMENTS - Ostomy and/or Continence Assessment and Care []  - 0 Incontinence Assessment and Management []  - 0 Ostomy Care Assessment and Management (repouching, etc.) PROCESS - Coordination of Care []  - 0 Simple Patient / Family Education for ongoing care []  - 0 Complex (extensive) Patient / Family Education for ongoing care []  - 0 Staff obtains Chiropractor, Records, T Results / Process Orders est []  - 0 Staff telephones HHA, Nursing Homes / Clarify orders / etc []  - 0 Routine Transfer to another Facility (non-emergent condition) []  - 0 Routine Hospital Admission (non-emergent condition) []  - 0 Avery Admissions / Manufacturing engineer / Ordering NPWT Apligraf, etc. , []  - 0 Emergency Hospital Admission (emergent condition) PROCESS - Special Needs []  - 0 Pediatric / Minor Patient Management []  - 0 Isolation Patient Management []  - 0 Hearing / Language / Visual special needs []  - 0 Assessment of Community assistance (transportation, D/C planning, etc.) []  - 0 Additional assistance / Altered mentation []  - 0 Support Surface(s) Assessment (bed, cushion, seat, etc.) INTERVENTIONS - Miscellaneous []  - 0 External ear exam []  - 0 Patient Transfer (multiple staff / Nurse, adult / Similar devices) []  - 0 Simple Staple /  Suture removal (25 or less) []  - 0 Complex Staple / Suture removal (26 or more) []  - 0 Hypo/Hyperglycemic Management (do not check if billed separately) []  - 0  Ankle / Brachial Index (ABI) - do not check if billed separately Has the patient been seen at the hospital within the last three years: Yes Total Score: 0 Level Of Care: ____ Electronic Signature(s) Signed: 07/16/2023 4:53:18 PM By: Betha Loa Entered By: Betha Loa on 07/16/2023 13:21:02 -------------------------------------------------------------------------------- Encounter Discharge Information Details Patient Name: Date of Service: [name] 07/16/2023 12:45 PM Medical Record Number: [id number] Patient Account Number: [id number] Date of Birth/Sex: Treating RN: 2006/01/01 (17 y.o. [name] [place], Cleophas Dunker ([id number]) [id number] Page 3 of 9 Primary Care Daishon Chui: PA Zenovia Jordan, NO Other Clinician: Betha Loa Referring Jeoffrey Eleazer: Treating Kassem Kibbe/Extender: Elizebeth Brooking, KRISTA Weeks in Treatment: 4 Encounter Discharge Information Items Post Procedure Vitals Discharge Condition: Stable Temperature (F): 98.3 Ambulatory Status: Ambulatory Pulse (bpm): 74 Discharge Destination: Home Respiratory Rate (breaths/min): 16 Transportation: Other Blood Pressure (mmHg): 119/74 Accompanied By: self Schedule Follow-up Appointment: Yes Clinical Summary of Care: Electronic Signature(s) Signed: 07/16/2023 4:53:18 PM By: Betha Loa Entered By: Betha Loa on 07/16/2023 13:24:56 -------------------------------------------------------------------------------- Lower Extremity Assessment Details Patient Name: Date of Service: [name] 07/16/2023 12:45 PM Medical Record Number: [id number] Patient Account Number: [id number] Date of Birth/Sex: Treating RN: [birth year] (17 y.o. [name] Primary Care Rani Sisney: PA Zenovia Jordan, NO Other Clinician: Betha Loa Referring Rui Wordell: Treating [name]/Extender: Elizebeth Brooking, KRISTA Weeks in Treatment: 4 Electronic Signature(s) Signed: 07/16/2023 4:35:52 PM By: Angelina Pih Signed: 07/16/2023 4:53:18 PM By: Betha Loa Entered By: Betha Loa on 07/16/2023 13:07:36 -------------------------------------------------------------------------------- Multi Wound Chart Details Patient Name: Date of Service: [name] 07/16/2023 12:45 PM Medical Record Number: [id number] Patient Account Number: [id number] Date of Birth/Sex: Treating RN: 19-Oct-2005 (17 y.o. [name] Primary Care Braysen Cloward: PA Zenovia Jordan, NO Other Clinician: Betha Loa Referring Youa Deloney: Treating Modesta Sammons/Extender: Elizebeth Brooking, KRISTA Weeks in Treatment: 4 Vital Signs Height(in): 67 Pulse(bpm): 74 Weight(lbs): 130 Blood Pressure(mmHg): 119/74 Body Mass Index(BMI): 20.4 Temperature(F): 98.3 Respiratory Rate(breaths/min): 16 [place], Cleophas Dunker ([id number]) [id number].pdf Page 4 of 9 [2:Photos:] [N/A:N/A] Right Calcaneus N/A N/A Wound Location: Pressure Injury N/A N/A Wounding Event: Pressure Ulcer N/A N/A Primary Etiology: 06/16/2022 N/A N/A Date Acquired: 4 N/A N/A Weeks of Treatment: Open N/A N/A Wound Status: No N/A N/A Wound Recurrence: 0.5x0.7x0.3 N/A N/A Measurements L x W x D (cm) 0.275 N/A N/A A (cm) : rea 0.082 N/A N/A Volume (cm) : 66.70% N/A N/A % Reduction in A rea: 83.40% N/A N/A % Reduction in Volume: Category/Stage III N/A N/A Classification: Medium N/A N/A Exudate A mount: Sanguinous N/A N/A Exudate Type: red N/A N/A Exudate Color: Small (1-33%) N/A N/A Granulation A mount: Red, Pink N/A N/A Granulation Quality: Medium (34-66%) N/A N/A Necrotic A mount: Fat Layer (Subcutaneous Tissue): Yes N/A N/A Exposed Structures: Fascia: No Tendon: No Muscle: No Joint: No Bone: No None N/A N/A Epithelialization: Treatment  Notes Electronic Signature(s) Signed: 07/16/2023 4:53:18 PM By: Betha Loa Entered By: Betha Loa on 07/16/2023 13:07:40 -------------------------------------------------------------------------------- Multi-Disciplinary Care Plan Details Patient Name: Date of Service: [name] 07/16/2023 12:45 PM Medical Record Number: [id number] Patient Account Number: [id number] Date of Birth/Sex: Treating RN: 01/10/2006 (17 y.o. [name] Primary Care Misako Roeder: PA Zenovia Jordan, NO Other Clinician: Betha Loa Referring Jasan Doughtie: Treating Deloros Beretta/Extender: Barnabas Harries Weeks in Treatment: 4 Active Inactive Necrotic Tissue Nursing Diagnoses: Impaired tissue integrity related to necrotic/devitalized tissue Knowledge deficit related to management of necrotic/devitalized tissue [name] ([id number]) [phone].pdf Page 5 of 9 Goals:  Necrotic/devitalized tissue will be minimized in the wound bed Date Initiated: 06/18/2023 Target Resolution Date: 07/18/2023 Goal Status: Active Patient/caregiver will verbalize understanding of reason and process for debridement of necrotic tissue Date Initiated: 06/18/2023 Target Resolution Date: 07/18/2023 Goal Status: Active Interventions: Assess patient pain level pre-, during and post procedure and prior to discharge Provide education on necrotic tissue and debridement process Treatment Activities: Excisional debridement : 06/17/2023 Notes: Pressure Nursing Diagnoses: Knowledge deficit related to causes and risk factors for pressure ulcer development Knowledge deficit related to management of pressures ulcers Potential for impaired tissue integrity related to pressure, friction, moisture, and shear Goals: Patient will remain free from development of additional pressure ulcers Date Initiated: 06/18/2023 Target Resolution Date: 07/18/2023 Goal Status: Active Patient will remain free of  pressure ulcers Date Initiated: 06/18/2023 Target Resolution Date: 07/18/2023 Goal Status: Active Patient/caregiver will verbalize risk factors for pressure ulcer development Date Initiated: 06/18/2023 Target Resolution Date: 07/11/2023 Goal Status: Active Patient/caregiver will verbalize understanding of pressure ulcer management Date Initiated: 06/18/2023 Target Resolution Date: 06/17/2023 Goal Status: Active Interventions: Assess offloading mechanisms upon admission and as needed Assess potential for pressure ulcer upon admission and as needed Provide education on pressure ulcers Notes: Wound/Skin Impairment Nursing Diagnoses: Impaired tissue integrity Knowledge deficit related to ulceration/compromised skin integrity Goals: Patient/caregiver will verbalize understanding of skin care regimen Date Initiated: 06/18/2023 Target Resolution Date: 07/18/2023 Goal Status: Active Ulcer/skin breakdown will have a volume reduction of 30% by week 4 Date Initiated: 06/18/2023 Target Resolution Date: 07/18/2023 Goal Status: Active Ulcer/skin breakdown will have a volume reduction of 50% by week 8 Date Initiated: 06/18/2023 Target Resolution Date: 08/17/2023 Goal Status: Active Ulcer/skin breakdown will have a volume reduction of 80% by week 12 Date Initiated: 06/18/2023 Target Resolution Date: 09/17/2023 Goal Status: Active Ulcer/skin breakdown will heal within 14 weeks Date Initiated: 06/18/2023 Target Resolution Date: 10/01/2023 Goal Status: Active Interventions: Assess patient/caregiver ability to obtain necessary supplies Assess patient/caregiver ability to perform ulcer/skin care regimen upon admission and as needed Assess ulceration(s) every visit Provide education on ulcer and skin care Treatment Activities: Skin care regimen initiated : 06/17/2023 [name] ([id number]) [phone].pdf Page 6 of 9 Notes: Electronic Signature(s) Signed:  07/16/2023 4:35:52 PM By: Angelina Pih Signed: 07/16/2023 4:53:18 PM By: Betha Loa Entered By: Betha Loa on 07/16/2023 13:21:10 -------------------------------------------------------------------------------- Pain Assessment Details Patient Name: Date of Service: [name] 07/16/2023 12:45 PM Medical Record Number: [id number] Patient Account Number: [id number] Date of Birth/Sex: Treating RN: 12-Oct-2005 (17 y.o. [name] Primary Care Gladie Gravette: PA Zenovia Jordan, NO Other Clinician: Betha Loa Referring Ayshia Gramlich: Treating Pricilla Moehle/Extender: Barnabas Harries Weeks in Treatment: 4 Active Problems Location of Pain Severity and Description of Pain Patient Has Paino No Site Locations Pain Management and Medication Current Pain Management: Electronic Signature(s) Signed: 07/16/2023 4:35:52 PM By: Angelina Pih Signed: 07/16/2023 4:53:18 PM By: Betha Loa Entered By: Betha Loa on 07/16/2023 12:54:29 [name] ([id number]) [id number] Page 7 of 9 -------------------------------------------------------------------------------- Patient/Caregiver Education Details Patient Name: Date of Service: [name] 11/21/2024andnbsp12:45 PM Medical Record Number: [id number] Patient Account Number: [id number] Date of Birth/Gender: Treating RN: September 08, 2005 (17 y.o. [name] Primary Care Physician: PA Zenovia Jordan, NO Other Clinician: Betha Loa Referring Physician: Treating Physician/Extender: Barnabas Harries Weeks in Treatment: 4 Education Assessment Education Provided To: Patient Education Topics Provided Wound/Skin Impairment: Handouts: Other: continue wound care as directed Methods: Explain/Verbal Responses: State content correctly Electronic Signature(s) Signed: 07/16/2023 4:53:18 PM By: Betha Loa  Entered By: Betha Loa on 07/16/2023  13:23:56 -------------------------------------------------------------------------------- Wound Assessment Details Patient Name: Date of Service: [name] 07/16/2023 12:45 PM Medical Record Number: [id number] Patient Account Number: [id number] Date of Birth/Sex: Treating RN: 03-28-2006 (17 y.o. [name] Primary Care Nykeem Citro: PA Zenovia Jordan, [place] Other Clinician: Betha Loa Referring Nashley Cordoba: Treating Marc Sivertsen/Extender: Elizebeth Brooking, KRISTA Weeks in Treatment: 4 Wound Status Wound Number: 2 Primary Etiology: Pressure Ulcer Wound Location: Right Calcaneus Wound Status: Open Wounding Event: Pressure Injury Date Acquired: 06/16/2022 Weeks Of Treatment: 4 Clustered Wound: No Photos [name] ([id number]) [id number] Page 8 of 9 Wound Measurements Length: (cm) 0.5 Width: (cm) 0.7 Depth: (cm) 0.3 Area: (cm) 0.275 Volume: (cm) 0.082 % Reduction in Area: 66.7% % Reduction in Volume: 83.4% Epithelialization: None Wound Description Classification: Category/Stage III Exudate Amount: Medium Exudate Type: Sanguinous Exudate Color: red Foul Odor After Cleansing: No Slough/Fibrino Yes Wound Bed Granulation Amount: Small (1-33%) Exposed Structure Granulation Quality: Red, Pink Fascia Exposed: No Necrotic Amount: Medium (34-66%) Fat Layer (Subcutaneous Tissue) Exposed: Yes Necrotic Quality: Adherent Slough Tendon Exposed: No Muscle Exposed: No Joint Exposed: No Bone Exposed: No Treatment Notes Wound #2 (Calcaneus) Wound Laterality: Right Cleanser Soap and Water Discharge Instruction: Gently cleanse wound with antibacterial soap, rinse and pat dry prior to dressing wounds Vashe 5.8 (oz) Discharge Instruction: Use vashe 5.8 (oz) as directed Peri-Wound Care Topical Gentamicin Discharge Instruction: Apply as directed by Usiel Astarita. Primary Dressing Hydrofera Blue Ready Transfer Foam, 2.5x2.5 (in/in) Discharge  Instruction: Apply Hydrofera Blue Ready to wound bed as directed Secondary Dressing Zetuvit Plus 4x4 (in/in) Heel Cup Secured With [employer] Sterile or Non-Sterile 6-ply 4.5x4 (yd/yd) Discharge Instruction: Apply Kerlix as directed Compression Wrap Compression Stockings Add-Ons Electronic Signature(s) Signed: 07/16/2023 4:35:52 PM By: Angelina Pih Signed: 07/16/2023 4:53:18 PM By: Betha Loa Entered By: Betha Loa on 07/16/2023 13:07:27 [name] ([id number]) [id number] Page 9 of 9 -------------------------------------------------------------------------------- Vitals Details Patient Name: Date of Service: [name] 07/16/2023 12:45 PM Medical Record Number: [id number] Patient Account Number: [id number] Date of Birth/Sex: Treating RN: [birth year] (17 y.o. [name] Primary Care Pape Parson: PA Zenovia Jordan, NO Other Clinician: Betha Loa Referring Vy Badley: Treating Breckin Savannah/Extender: Elizebeth Brooking, KRISTA Weeks in Treatment: 4 Vital Signs Time Taken: 12:52 Temperature (F): 98.3 Height (in): 67 Pulse (bpm): 74 Weight (lbs): 130 Respiratory Rate (breaths/min): 16 Body Mass Index (BMI): 20.4 Blood Pressure (mmHg): 119/74 Reference Range: 80 - 120 mg / dl Electronic Signature(s) Signed: 07/16/2023 4:53:18 PM By: Betha Loa Entered By: Betha Loa on 07/16/2023 12:54:25

## 2023-07-17 ENCOUNTER — Ambulatory Visit: Payer: Medicaid Other | Admitting: Physician Assistant

## 2023-07-22 ENCOUNTER — Encounter: Payer: Medicaid Other | Admitting: Physician Assistant

## 2023-07-22 DIAGNOSIS — L89613 Pressure ulcer of right heel, stage 3: Secondary | ICD-10-CM | POA: Diagnosis not present

## 2023-07-22 NOTE — Progress Notes (Signed)
[name] ([id number]) [id number] Page 1 of 9 Visit Report for 07/22/2023 Arrival Information Details Patient Name: Date of Service: [name] 07/22/2023 9:30 A M Medical Record Number: [id number] Patient Account Number: [id number] Date of Birth/Sex: Treating RN: 02/21/2006 (17 y.o. [name] Primary Care [name]: PA Zenovia Jordan, NO Other Clinician: Referring [name]: Treating [name]/Extender: [name] Weeks in Treatment: 5 Visit Information History Since Last Visit Added or deleted any medications: No Patient Arrived: Ambulatory Any Avery allergies or adverse reactions: No Arrival Time: 09:32 Has Dressing in Place as Prescribed: Yes Accompanied By: self Has Footwear/Offloading in Place as Prescribed: Yes Transfer Assistance: None Right: T Contact Cast otal Patient Identification Verified: Yes Pain Present Now: No Secondary Verification Process Completed: Yes Patient Requires Transmission-Based Precautions: No Patient Has Alerts: No Electronic Signature(s) Signed: 07/22/2023 10:03:39 AM By: Midge Aver MSN RN CNS WTA Entered By: Midge Aver on 07/22/2023 10:03:38 -------------------------------------------------------------------------------- Clinic Level of Care Assessment Details Patient Name: Date of Service: [name] 07/22/2023 9:30 A M Medical Record Number: [id number] Patient Account Number: [id number] Date of Birth/Sex: Treating RN: 2005/12/16 (17 y.o. [name] Primary Care [name]: PA Zenovia Jordan, NO Other Clinician: Referring [name]: Treating [name]/Extender: [name], KRISTA Weeks in Treatment: 5 Clinic Level of Care Assessment Items TOOL 1 Quantity Score []  - 0 Use when EandM and Procedure is performed on INITIAL visit ASSESSMENTS - Nursing Assessment / Reassessment []  - 0 General Physical Exam (combine w/ comprehensive assessment (listed just below) when performed  on Avery pt. evals) []  - 0 Comprehensive Assessment (HX, ROS, Risk Assessments, Wounds Hx, etc.) ASSESSMENTS - Wound and Skin Assessment / Reassessment []  - 0 Dermatologic / Skin Assessment (not related to wound area) ASSESSMENTS - Ostomy and/or Continence Assessment and Care USHER, HOFACKER ([id number]) [id number] Page 2 of 9 []  - 0 Incontinence Assessment and Management []  - 0 Ostomy Care Assessment and Management (repouching, etc.) PROCESS - Coordination of Care []  - 0 Simple Patient / Family Education for ongoing care []  - 0 Complex (extensive) Patient / Family Education for ongoing care []  - 0 Staff obtains Chiropractor, Records, T Results / Process Orders est []  - 0 Staff telephones HHA, Nursing Homes / Clarify orders / etc []  - 0 Routine Transfer to another Facility (non-emergent condition) []  - 0 Routine Hospital Admission (non-emergent condition) []  - 0 Avery Admissions / Manufacturing engineer / Ordering NPWT Apligraf, etc. , []  - 0 Emergency Hospital Admission (emergent condition) PROCESS - Special Needs []  - 0 Pediatric / Minor Patient Management []  - 0 Isolation Patient Management []  - 0 Hearing / Language / Visual special needs []  - 0 Assessment of Community assistance (transportation, D/C planning, etc.) []  - 0 Additional assistance / Altered mentation []  - 0 Support Surface(s) Assessment (bed, cushion, seat, etc.) INTERVENTIONS - Miscellaneous []  - 0 External ear exam []  - 0 Patient Transfer (multiple staff / Nurse, adult / Similar devices) []  - 0 Simple Staple / Suture removal (25 or less) []  - 0 Complex Staple / Suture removal (26 or more) []  - 0 Hypo/Hyperglycemic Management (do not check if billed separately) []  - 0 Ankle / Brachial Index (ABI) - do not check if billed separately Has the patient been seen at the hospital within the last three years: Yes Total Score: 0 Level Of Care: ____ Electronic Signature(s) Signed:  07/22/2023 4:53:00 PM By: Midge Aver MSN RN CNS WTA Entered By: Midge Aver on 07/22/2023 10:05:36 --------------------------------------------------------------------------------  Encounter Discharge Information Details Patient Name: Date of Service: [name] 07/22/2023 9:30 A M Medical Record Number: [id number] Patient Account Number: [id number] Date of Birth/Sex: Treating RN: [birth year] (17 y.o. [name] Primary Care Mavrick Mcquigg: PA Zenovia Jordan, NO Other Clinician: Referring Elin Fenley: Treating Latica Hohmann/Extender: [name] Weeks in Treatment: 5 Encounter Discharge Information Items Post Procedure Vitals Discharge Condition: Stable Temperature (F): 97.8 Guy, Cleophas Dunker ([id number]) [id number] Page 3 of 9 Ambulatory Status: Ambulatory Pulse (bpm): 63 Discharge Destination: Home Respiratory Rate (breaths/min): 18 Transportation: Private Auto Blood Pressure (mmHg): 110/68 Accompanied By: self Schedule Follow-up Appointment: Yes Clinical Summary of Care: Electronic Signature(s) Signed: 07/22/2023 4:53:00 PM By: Midge Aver MSN RN CNS WTA Entered By: Midge Aver on 07/22/2023 10:13:12 -------------------------------------------------------------------------------- Lower Extremity Assessment Details Patient Name: Date of Service: [name] 07/22/2023 9:30 A M Medical Record Number: [id number] Patient Account Number: [id number] Date of Birth/Sex: Treating RN: 2006-03-14 (17 y.o. [name] Primary Care [name]: PA Zenovia Jordan, NO Other Clinician: Referring [name]: Treating [name]/Extender: [name] Weeks in Treatment: 5 Electronic Signature(s) Signed: 07/22/2023 10:03:58 AM By: Midge Aver MSN RN CNS WTA Entered By: Midge Aver on 07/22/2023 10:03:58 -------------------------------------------------------------------------------- Multi Wound Chart Details Patient Name: Date of  Service: [name] 07/22/2023 9:30 A M Medical Record Number: [id number] Patient Account Number: [id number] Date of Birth/Sex: Treating RN: [birth year] (17 y.o. [name] Primary Care [name]: PA TIENT, NO Other Clinician: Referring Grae Leathers: Treating Lavonne Kinderman/Extender: [name], KRISTA Weeks in Treatment: 5 Vital Signs Height(in): 67 Pulse(bpm): 63 Weight(lbs): 130 Blood Pressure(mmHg): 110/68 Body Mass Index(BMI): 20.4 Temperature(F): 97.8 Respiratory Rate(breaths/min): 16 [2:Photos:] [N/A:N/A] Right Calcaneus N/A N/A Wound Location: Pressure Injury N/A N/A Wounding Event: Pressure Ulcer N/A N/A Primary Etiology: 06/16/2022 N/A N/A Date Acquired: 5 N/A N/A Weeks of Treatment: Open N/A N/A Wound Status: No N/A N/A Wound Recurrence: 0.5x0.7x0.2 N/A N/A Measurements L x W x D (cm) 0.275 N/A N/A A (cm) : rea 0.055 N/A N/A Volume (cm) : 66.70% N/A N/A % Reduction in A rea: 88.90% N/A N/A % Reduction in Volume: Category/Stage III N/A N/A Classification: Medium N/A N/A Exudate A mount: Sanguinous N/A N/A Exudate Type: red N/A N/A Exudate Color: Small (1-33%) N/A N/A Granulation A mount: Red, Pink N/A N/A Granulation Quality: Medium (34-66%) N/A N/A Necrotic A mount: Fat Layer (Subcutaneous Tissue): Yes N/A N/A Exposed Structures: Fascia: No Tendon: No Muscle: No Joint: No Bone: No None N/A N/A Epithelialization: T Contact Cast otal N/A N/A Procedures Performed: Treatment Notes Wound #2 (Calcaneus) Wound Laterality: Right Cleanser Soap and Water Discharge Instruction: Gently cleanse wound with antibacterial soap, rinse and pat dry prior to dressing wounds Vashe 5.8 (oz) Discharge Instruction: Use vashe 5.8 (oz) as directed Peri-Wound Care Topical Gentamicin Discharge Instruction: Apply as directed by Oakley Orban. Primary Dressing Hydrofera Blue Ready Transfer Foam, 2.5x2.5 (in/in) Discharge Instruction: Apply  Hydrofera Blue Ready to wound bed as directed Secondary Dressing Zetuvit Plus 4x4 (in/in) Heel Cup Secured With [employer] Sterile or Non-Sterile 6-ply 4.5x4 (yd/yd) Discharge Instruction: Apply Kerlix as directed Compression Wrap Compression Stockings Add-Ons Electronic Signature(s) Signed: 07/22/2023 4:53:00 PM By: Midge Aver MSN RN CNS WTA Previous Signature: 07/22/2023 10:04:06 AM Version By: Midge Aver MSN RN CNS WTA Entered By: Midge Aver on 07/22/2023 10:10:13 [name] ([id number]) [id number] Page 5 of 9 -------------------------------------------------------------------------------- Multi-Disciplinary Care Plan Details Patient Name: Date of Service: [name] 07/22/2023 9:30 A  M Medical Record Number: [id number] Patient Account Number: [id number] Date of Birth/Sex: Treating RN: Mar 31, 2006 (17 y.o. [name] Primary Care Francely Craw: PA Zenovia Jordan, NO Other Clinician: Referring Dezaree Tracey: Treating Presley Gora/Extender: [name], KRISTA Weeks in Treatment: 5 Active Inactive Pressure Nursing Diagnoses: Knowledge deficit related to causes and risk factors for pressure ulcer development Knowledge deficit related to management of pressures ulcers Potential for impaired tissue integrity related to pressure, friction, moisture, and shear Goals: Patient will remain free from development of additional pressure ulcers Date Initiated: 06/18/2023 Date Inactivated: 07/22/2023 Target Resolution Date: 07/18/2023 Goal Status: Met Patient will remain free of pressure ulcers Date Initiated: 06/18/2023 Date Inactivated: 07/22/2023 Target Resolution Date: 07/18/2023 Goal Status: Met Patient/caregiver will verbalize risk factors for pressure ulcer development Date Initiated: 06/18/2023 Date Inactivated: 07/22/2023 Target Resolution Date: 07/11/2023 Goal Status: Met Patient/caregiver will verbalize understanding of pressure ulcer  management Date Initiated: 06/18/2023 Target Resolution Date: 08/17/2023 Goal Status: Active Interventions: Assess offloading mechanisms upon admission and as needed Assess potential for pressure ulcer upon admission and as needed Provide education on pressure ulcers Notes: Wound/Skin Impairment Nursing Diagnoses: Impaired tissue integrity Knowledge deficit related to ulceration/compromised skin integrity Goals: Patient/caregiver will verbalize understanding of skin care regimen Date Initiated: 06/18/2023 Date Inactivated: 07/22/2023 Target Resolution Date: 07/18/2023 Goal Status: Met Ulcer/skin breakdown will have a volume reduction of 30% by week 4 Date Initiated: 06/18/2023 Date Inactivated: 07/22/2023 Target Resolution Date: 07/18/2023 Goal Status: Met Ulcer/skin breakdown will have a volume reduction of 50% by week 8 Date Initiated: 06/18/2023 Target Resolution Date: 08/17/2023 Goal Status: Active Ulcer/skin breakdown will have a volume reduction of 80% by week 12 Date Initiated: 06/18/2023 Target Resolution Date: 09/17/2023 Goal Status: Active Ulcer/skin breakdown will heal within 14 weeks Date Initiated: 06/18/2023 Target Resolution Date: 10/01/2023 Goal Status: Active GRAISYN, BULLERS ([id number]) [id number] Page 6 of 9 Interventions: Assess patient/caregiver ability to obtain necessary supplies Assess patient/caregiver ability to perform ulcer/skin care regimen upon admission and as needed Assess ulceration(s) every visit Provide education on ulcer and skin care Treatment Activities: Skin care regimen initiated : 06/17/2023 Notes: Electronic Signature(s) Signed: 07/22/2023 4:53:00 PM By: Midge Aver MSN RN CNS WTA Entered By: Midge Aver on 07/22/2023 10:07:36 -------------------------------------------------------------------------------- Pain Assessment Details Patient Name: Date of Service: [name] 07/22/2023 9:30 A  M Medical Record Number: [id number] Patient Account Number: [id number] Date of Birth/Sex: Treating RN: February 09, 2006 (17 y.o. [name] Primary Care Ulus Hazen: PA Zenovia Jordan, NO Other Clinician: Referring Paizley Ramella: Treating Jaziah Goeller/Extender: [name], KRISTA Weeks in Treatment: 5 Active Problems Location of Pain Severity and Description of Pain Patient Has Paino No Site Locations Pain Management and Medication Current Pain Management: Electronic Signature(s) Signed: 07/22/2023 10:03:46 AM By: Midge Aver MSN RN CNS WTA Entered By: Midge Aver on 07/22/2023 10:03:46 [name] ([id number]) [id number] Page 7 of 9 -------------------------------------------------------------------------------- Patient/Caregiver Education Details Patient Name: Date of Service: [name] 11/27/2024andnbsp9:30 A M Medical Record Number: [id number] Patient Account Number: [id number] Date of Birth/Gender: Treating RN: 2006/02/06 (17 y.o. [name] Primary Care Physician: PA Zenovia Jordan, NO Other Clinician: Referring Physician: Treating Physician/Extender: [name] Weeks in Treatment: 5 Education Assessment Education Provided To: Patient Education Topics Provided Wound/Skin Impairment: Handouts: Caring for Your Ulcer Methods: Explain/Verbal Responses: State content correctly Electronic Signature(s) Signed: 07/22/2023 4:53:00 PM By: Midge Aver MSN RN CNS WTA Entered By: Midge Aver on 07/22/2023 10:07:47 -------------------------------------------------------------------------------- Wound Assessment Details Patient Name: Date of Service: Morton Amy  IO R 07/22/2023 9:30 A M Medical Record Number: [id number] Patient Account Number: [id number] Date of Birth/Sex: Treating RN: Apr 13, 2006 (17 y.o. [name] Primary Care Jil Penland: PA Zenovia Jordan, NO Other Clinician: Referring Ambera Fedele: Treating Tila Millirons/Extender: [name], KRISTA Weeks in Treatment: 5 Wound Status Wound Number: 2 Primary Etiology: Pressure Ulcer Wound Location: Right Calcaneus Wound Status: Open Wounding Event: Pressure Injury Date Acquired: 06/16/2022 Weeks Of Treatment: 5 Clustered Wound: No Photos [name] ([id number]) [id number] Page 8 of 9 Wound Measurements Length: (cm) 0.5 Width: (cm) 0.7 Depth: (cm) 0.2 Area: (cm) 0.275 Volume: (cm) 0.055 % Reduction in Area: 66.7% % Reduction in Volume: 88.9% Epithelialization: None Wound Description Classification: Category/Stage III Exudate Amount: Medium Exudate Type: Sanguinous Exudate Color: red Foul Odor After Cleansing: No Slough/Fibrino Yes Wound Bed Granulation Amount: Small (1-33%) Exposed Structure Granulation Quality: Red, Pink Fascia Exposed: No Necrotic Amount: Medium (34-66%) Fat Layer (Subcutaneous Tissue) Exposed: Yes Necrotic Quality: Adherent Slough Tendon Exposed: No Muscle Exposed: No Joint Exposed: No Bone Exposed: No Treatment Notes Wound #2 (Calcaneus) Wound Laterality: Right Cleanser Soap and Water Discharge Instruction: Gently cleanse wound with antibacterial soap, rinse and pat dry prior to dressing wounds Vashe 5.8 (oz) Discharge Instruction: Use vashe 5.8 (oz) as directed Peri-Wound Care Topical Gentamicin Discharge Instruction: Apply as directed by Lamis Behrmann. Primary Dressing Hydrofera Blue Ready Transfer Foam, 2.5x2.5 (in/in) Discharge Instruction: Apply Hydrofera Blue Ready to wound bed as directed Secondary Dressing Zetuvit Plus 4x4 (in/in) Heel Cup Secured With [employer] Sterile or Non-Sterile 6-ply 4.5x4 (yd/yd) Discharge Instruction: Apply Kerlix as directed Compression Wrap Compression Stockings Add-Ons Electronic Signature(s) Signed: 07/22/2023 4:53:00 PM By: Midge Aver MSN RN CNS WTA Entered By: Midge Aver on 07/22/2023 09:47:19 [name] ([id number])  [id number] Page 9 of 9 -------------------------------------------------------------------------------- Vitals Details Patient Name: Date of Service: [name] 07/22/2023 9:30 A M Medical Record Number: [id number] Patient Account Number: [id number] Date of Birth/Sex: Treating RN: [birth year] (17 y.o. [name] Primary Care Deane Melick: PA Zenovia Jordan, NO Other Clinician: Referring Lillie Portner: Treating Karilyn Wind/Extender: [name], KRISTA Weeks in Treatment: 5 Vital Signs Time Taken: 09:33 Temperature (F): 97.8 Height (in): 67 Pulse (bpm): 63 Weight (lbs): 130 Respiratory Rate (breaths/min): 16 Body Mass Index (BMI): 20.4 Blood Pressure (mmHg): 110/68 Reference Range: 80 - 120 mg / dl Electronic Signature(s) Signed: 07/22/2023 10:03:42 AM By: Midge Aver MSN RN CNS WTA Entered By: Midge Aver on 07/22/2023 10:03:41

## 2023-07-22 NOTE — Progress Notes (Signed)
REBA, FUDA ([id number]) [id number] Page 1 of 8 Visit Report for 07/22/2023 Chief Complaint Document Details Patient Name: Date of Service: [name] 07/22/2023 9:30 A M Medical Record Number: [id number] Patient Account Number: [id number] Date of Birth/Sex: Treating RN: 2006/03/05 (17 y.o. [name] Primary Care Provider: PA Zenovia Jordan, NO Other Clinician: Referring Provider: Treating Provider/Extender: Elizebeth Brooking, KRISTA Weeks in Treatment: 5 Information Obtained from: Patient Chief Complaint Pressure ulcer left foot due to brace 06/17/2023; patient is here for review of a pressure area on the right plantar heel Electronic Signature(s) Signed: 07/22/2023 9:31:14 AM By: Allen Derry PA-C Entered By: Allen Derry on 07/22/2023 09:31:14 -------------------------------------------------------------------------------- Debridement Details Patient Name: Date of Service: [name] 07/22/2023 9:30 A M Medical Record Number: [id number] Patient Account Number: [id number] Date of Birth/Sex: Treating RN: Aug 06, 2006 (17 y.o. [name] Primary Care Provider: PA Zenovia Jordan, NO Other Clinician: Referring Provider: Treating Provider/Extender: Barnabas Harries Weeks in Treatment: 5 Debridement Performed for Assessment: Wound #2 Right Calcaneus Performed By: Physician Allen Derry, PA-C The following information was scribed by: Midge Aver The information was scribed for: Allen Derry Debridement Type: Debridement Level of Consciousness (Pre-procedure): Awake and Alert Pre-procedure Verification/Time Out Yes - 10:10 Taken: Start Time: 10:10 Percent of Wound Bed Debrided: 100% T Area Debrided (cm): otal 0.27 Tissue and other material debrided: Viable, Non-Viable, Callus, Slough, Subcutaneous, Slough Level: Skin/Subcutaneous Tissue Debridement Description: Excisional Instrument: Curette Bleeding: Moderate Hemostasis  Achieved: Pressure Procedural Pain: 0 Kevin, Avery ([id number]) [id number] Page 2 of 8 Post Procedural Pain: 0 Response to Treatment: Procedure was tolerated well Level of Consciousness (Post- Awake and Alert procedure): Post Debridement Measurements of Total Wound Length: (cm) 0.5 Stage: Category/Stage III Width: (cm) 0.7 Depth: (cm) 0.2 Volume: (cm) 0.055 Character of Wound/Ulcer Post Debridement: Stable Post Procedure Diagnosis Same as Pre-procedure Electronic Signature(s) Signed: 07/22/2023 4:40:04 PM By: Allen Derry PA-C Signed: 07/22/2023 4:53:00 PM By: Midge Aver MSN RN CNS WTA Entered By: Midge Aver on 07/22/2023 10:11:43 -------------------------------------------------------------------------------- HPI Details Patient Name: Date of Service: [name] 07/22/2023 9:30 A M Medical Record Number: [id number] Patient Account Number: [id number] Date of Birth/Sex: Treating RN: 2005/09/17 (17 y.o. [name] Primary Care Provider: PA Zenovia Jordan, NO Other Clinician: Referring Provider: Treating Provider/Extender: Elizebeth Brooking, KRISTA Weeks in Treatment: 5 History of Present Illness HPI Description: 01/20/2020 upon evaluation today patient presents for initial inspection here in our clinic concerning issues that he has been having with his left foot laterally at the fifth metatarsal location. It appears that he has issues unfortunately with chronic rubbing on his brace. He does have spina bifida and he has issues with this fairly frequently. With that being said the good news is this does not appear to be infected. The bad news is it is a recurrent issue. He is can be having surgery for foot reconstruction June 8. 01/27/2020 on evaluation today patient appears to be doing about the same in regard to his wound at this point. Fortunately there is no signs of active infection. Overall I feel like he is actually managing quite well.  The wound is very macerated I think this is simply due to the fact that he has been using a Band-Aid as opposed to an absorptive bandage over top of the collagen. He really has nothing to drain into and then been putting over top of this Covan which is occluding it even more. 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 He wears an AFO brace on both feet. Noteworthy that he was here in 2021 for an area on the left foot He tells me that he has had a wound on the plantar right heel for the better part of a year. Prior to this this may have opened and closed repetitively. He has not been specifically dressing this and he became alarmed when it would not close and also that there may have been an odor noticeable. He is here for a review of this. 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 ABI in our clinic was 1.22 on the right 10/30; x-ray of the right heel was normal no osseous destruction. His PCR culture that I did last week showed E. coli various anaerobes and MSSA. We have been using Hydrofera Blue and a heel offloading boot 07-01-2023 upon evaluation today patient appears to be doing decently well currently in regard to his wound. Fortunately there does not appear to be any signs of active infection at this time. This is a gentleman whom I have seen previously about 3 years ago. He actually is doing decently well at this point he did have some debridement last week with Dr. Leanord Hawking he said to have a cast today he will be come back on Friday to change this out. 07-03-2023 upon evaluation today patient appears to be doing well  currently in regard to his wound. He has been tolerating the dressing changes without complication. Fortunately there does not appear to be any signs of infection which is good news I think the cast that a great job. He is here for the first cast [name], [name] ([id number]) [id number] Page 3 of 8 change to ensure nothing was rubbing the medial part of his ankle was the only place in question he thinks a bigger boot could potentially benefit him there. 07-08-2023 patient's wound currently is showing signs of good improvement I am actually very pleased with where we stand is already measuring smaller and looking better I feel that the depth is filling in and I am very pleased in that regard. I do not see any signs of infection the cast seems to be doing well with this and make sure to pad his ankle area to make sure that nothing is rubbing in this region. 07-16-2023 upon evaluation today patient's heel ulcer has not made quite as much improvement as I like to see although a lot of the callus has loosened up around the edges of the wound has become obvious that I can clear away a lot more this callus and what I previously was able to with the being softer. For that reason I did go ahead and at this point perform debridement clearway necrotic debris and the patient tolerated this today without complication. Postdebridement the wound bed actually appears to be doing much better. 07-22-2023 upon evaluation today patient appears to be doing well currently in regard to his wound although I am a little concerned about how the cast is pushing on the side of his ankle. The biggest issue that I see is simply that it is when his ankle being so why it  is put into the boot that it does not quite fit and it therefore pushes a lot inward on that medial ankle. I think that if he can use his walker and not have to use the black part of the outer boot for his cast that we will take care of the  issue and prevent this from causing problems he tells me has been very active doing a lot of walking I think that is been a big part of the issue. Electronic Signature(s) Signed: 07/22/2023 10:28:49 AM By: Allen Derry PA-C Entered By: Allen Derry on 07/22/2023 10:28:49 -------------------------------------------------------------------------------- Physical Exam Details Patient Name: Date of Service: [name] 07/22/2023 9:30 A M Medical Record Number: [id number] Patient Account Number: [id number] Date of Birth/Sex: Treating RN: 2006-04-07 (17 y.o. [name] Primary Care Provider: PA Zenovia Jordan, NO Other Clinician: Referring Provider: Treating Provider/Extender: Elizebeth Brooking, KRISTA Weeks in Treatment: 5 Constitutional Well-nourished and well-hydrated in no acute distress. Respiratory normal breathing without difficulty. Psychiatric this patient is able to make decisions and demonstrates good insight into disease process. Alert and Oriented x 3. pleasant and cooperative. Notes Much better which is great news and in general I think that we are moving in the right direction here. I did perform debridement clearway necrotic debris today and the patient tolerated that without complication postdebridement wound bed appears to be doing no fevers, chills, nausea, vomiting, or diarrhea. My plan is still to reapply the total contact cast on the recommend that he not do a lot of walking with the boot on minimize that and mainly use his walker where he can keep pressure off of his heel altogether. Electronic Signature(s) Signed: 07/22/2023 10:28:58 AM By: Allen Derry PA-C Entered By: Allen Derry on 07/22/2023 10:28:58 [name] ([id number]) [id number] Page 4 of 8 -------------------------------------------------------------------------------- Physician Orders Details Patient Name: Date of Service: [name] 07/22/2023 9:30 A  M Medical Record Number: [id number] Patient Account Number: [id number] Date of Birth/Sex: Treating RN: 06/30/2006 (17 y.o. 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 Bathing/ Shower/ Hygiene Clean wound with Normal Saline or wound cleanser. Anesthetic (Use 'Patient Medications' Section for Anesthetic Order Entry) Lidocaine applied to wound bed Off-Loading Total Contact Cast to Right Lower Extremity Wound Treatment Wound #2 - Calcaneus Wound Laterality: Right Cleanser: Soap and Water 3 x Per Week/30 Days Discharge Instructions: Gently cleanse wound with antibacterial soap, rinse and pat dry prior to dressing wounds Cleanser: Vashe 5.8 (oz) 3 x Per Week/30 Days Discharge Instructions: Use vashe 5.8 (oz) as directed Topical: Gentamicin 3 x Per Week/30 Days Discharge Instructions: Apply as directed by provider. Prim Dressing: Hydrofera Blue Ready Transfer Foam, 2.5x2.5 (in/in) 3 x Per Week/30 Days ary Discharge Instructions: Apply Hydrofera Blue Ready to wound bed as directed Secondary Dressing: Zetuvit Plus 4x4 (in/in) 3 x Per Week/30 Days Secondary Dressing: Heel Cup 3 x Per Week/30 Days Secured With: [employer] or Non-Sterile 6-ply 4.5x4 (yd/yd) 3 x Per Week/30 Days Discharge Instructions: Apply Kerlix as directed Electronic Signature(s) Signed: 07/22/2023 4:40:04 PM By: Allen Derry PA-C Signed: 07/22/2023 4:53:00 PM By: Midge Aver  MSN RN CNS WTA Previous Signature: 07/22/2023 10:05:30 AM Version By: Midge Aver MSN RN CNS  WTA Entered By: Midge Aver on 07/22/2023 10:12:27 Problem List Details -------------------------------------------------------------------------------- [name] ([id number]) [id number] Page 5 of 8 Patient Name: Date of Service: [name] 07/22/2023 9:30 A M Medical Record Number: [id number] Patient Account Number: [id number] Date of Birth/Sex: Treating RN: [birth year] (17 y.o. 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 [name] Primary Care Provider: PA TIENT, NO Other Clinician: Referring Provider: Treating Provider/Extender: Elizebeth Brooking, KRISTA Weeks in Treatment: 5 Subjective Chief Complaint Information obtained from Patient Pressure ulcer left foot due to brace 06/17/2023; patient is here for review of a pressure area on the right plantar heel History of Present Illness (HPI) 01/20/2020 upon evaluation today patient presents for initial inspection here in our clinic concerning issues that he has been having with his left foot laterally at the fifth metatarsal location. It appears that he has issues unfortunately with chronic rubbing on his  brace. He does have spina bifida and he has issues with this fairly frequently. With that being said the good news is this does not appear to be infected. The bad news is it is a recurrent issue. He is can be having surgery for foot reconstruction June 8. 01/27/2020 on evaluation today patient appears to be doing about the same in regard to his wound at this point. Fortunately there is no signs of active infection. Overall I feel like he is actually managing quite well. The wound is very macerated I think this is simply due to the fact that he has been using a Band-Aid as opposed to an absorptive bandage over top of the collagen. He really has nothing to drain into and then been putting over top of this Covan which is occluding it even more. 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 He wears an AFO brace on both feet. Noteworthy that he was here in 2021 for an area on the left foot [name], [name] ([id number]) [id number] Page 6 of 8 He tells me that he has had a wound on the plantar right heel for the better part of a year. Prior to this this may have opened and closed repetitively. He has not been specifically dressing this and he became alarmed when it would not close and also that there may have been an odor noticeable. He is here for a review of this. 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 ABI in our clinic was 1.22 on the right 10/30; x-ray of  the right heel was normal no osseous destruction. His PCR culture that I did last week showed E. coli various anaerobes and MSSA. We have been using  Hydrofera Blue and a heel offloading boot 07-01-2023 upon evaluation today patient appears to be doing decently well currently in regard to his wound. Fortunately there does not appear to be any signs of active infection at this time. This is a gentleman whom I have seen previously about 3 years ago. He actually is doing decently well at this point he did have some debridement last week with Dr. Leanord Hawking he said to have a cast today he will be come back on Friday to change this out. 07-03-2023 upon evaluation today patient appears to be doing well currently in regard to his wound. He has been tolerating the dressing changes without complication. Fortunately there does not appear to be any signs of infection which is good news I think the cast that a great job. He is here for the first cast change to ensure nothing was rubbing the medial part of his ankle was the only place in question he thinks a bigger boot could potentially benefit him there. 07-08-2023 patient's wound currently is showing signs of good improvement I am actually very pleased with where we stand is already measuring smaller and looking better I feel that the depth is filling in and I am very pleased in that regard. I do not see any signs of infection the cast seems to be doing well with this and make sure to pad his ankle area to make sure that nothing is rubbing in this region. 07-16-2023 upon evaluation today patient's heel ulcer has not made quite as much improvement as I like to see although a lot of the callus has loosened up around the edges of the wound has become obvious that I can clear away a lot more this callus and what I previously was able to with the being softer. For that reason I did go ahead and at this point perform debridement clearway necrotic debris and the patient tolerated this today without complication. Postdebridement the wound bed actually appears to be doing much better. 07-22-2023 upon evaluation today  patient appears to be doing well currently in regard to his wound although I am a little concerned about how the cast is pushing on the side of his ankle. The biggest issue that I see is simply that it is when his ankle being so why it is put into the boot that it does not quite fit and it therefore pushes a lot inward on that medial ankle. I think that if he can use his walker and not have to use the black part of the outer boot for his cast that we will take care of the issue and prevent this from causing problems he tells me has been very active doing a lot of walking I think that is been a big part of the issue. Objective Constitutional Well-nourished and well-hydrated in no acute distress. Vitals Time Taken: 9:33 AM, Height: 67 in, Weight: 130 lbs, BMI: 20.4, Temperature: 97.8 F, Pulse: 63 bpm, Respiratory Rate: 16 breaths/min, Blood Pressure: 110/68 mmHg. Respiratory normal breathing without difficulty. Psychiatric this patient is able to make decisions and demonstrates good insight into disease process. Alert and Oriented x 3. pleasant and cooperative. General Notes: Much better which is great news and in general I think that we are moving in the right direction here. I did perform debridement clearway necrotic debris today  and the patient tolerated that without complication postdebridement wound bed appears to be doing no fevers, chills, nausea, vomiting, or diarrhea. My plan is still to reapply the total contact cast on the recommend that he not do a lot of walking with the boot on minimize that and mainly use his walker where he can keep pressure off of his heel altogether. Integumentary (Hair, Skin) Wound #2 status is Open. Original cause of wound was Pressure Injury. The date acquired was: 06/16/2022. The wound has been in treatment 5 weeks. The wound is located on the Right Calcaneus. The wound measures 0.5cm length x 0.7cm width x 0.2cm depth; 0.275cm^2 area and 0.055cm^3 volume.  There is Fat Layer (Subcutaneous Tissue) exposed. There is a medium amount of sanguinous drainage noted. There is small (1-33%) red, pink granulation within the wound bed. There is a medium (34-66%) amount of necrotic tissue within the wound bed including Adherent Slough. Assessment Active Problems ICD-10 Pressure ulcer of right heel, stage 3 Lumbar spina bifida without hydrocephalus Procedures Wound #2 [name] ([id number]) [id number] Page 7 of 8 Pre-procedure diagnosis of Wound #2 is a Pressure Ulcer located on the Right Calcaneus . There was a Excisional Skin/Subcutaneous Tissue Debridement with a total area of 0.27 sq cm performed by Allen Derry, PA-C. With the following instrument(s): Curette to remove Viable and Non-Viable tissue/material. Material removed includes Callus, Subcutaneous Tissue, and Slough. No specimens were taken. A time out was conducted at 10:10, prior to the start of the procedure. A Moderate amount of bleeding was controlled with Pressure. The procedure was tolerated well with a pain level of 0 throughout and a pain level of 0 following the procedure. Post Debridement Measurements: 0.5cm length x 0.7cm width x 0.2cm depth; 0.055cm^3 volume. Post debridement Stage noted as Category/Stage III. Character of Wound/Ulcer Post Debridement is stable. Post procedure Diagnosis Wound #2: Same as Pre-Procedure Pre-procedure diagnosis of Wound #2 is a Pressure Ulcer located on the Right Calcaneus . There was a T Research scientist (life sciences) Procedure by Allen Derry, PA-C. otal Post procedure Diagnosis Wound #2: Same as Pre-Procedure Plan Follow-up Appointments: Return Appointment in 1 week. Bathing/ Shower/ Hygiene: Clean wound with Normal Saline or wound cleanser. Anesthetic (Use 'Patient Medications' Section for Anesthetic Order Entry): Lidocaine applied to wound bed Off-Loading: T Contact Cast to Right Lower Extremity otal WOUND #2: - Calcaneus  Wound Laterality: Right Cleanser: Soap and Water 3 x Per Week/30 Days Discharge Instructions: Gently cleanse wound with antibacterial soap, rinse and pat dry prior to dressing wounds Cleanser: Vashe 5.8 (oz) 3 x Per Week/30 Days Discharge Instructions: Use vashe 5.8 (oz) as directed Topical: Gentamicin 3 x Per Week/30 Days Discharge Instructions: Apply as directed by provider. Prim Dressing: Hydrofera Blue Ready Transfer Foam, 2.5x2.5 (in/in) 3 x Per Week/30 Days ary Discharge Instructions: Apply Hydrofera Blue Ready to wound bed as directed Secondary Dressing: Zetuvit Plus 4x4 (in/in) 3 x Per Week/30 Days Secondary Dressing: Heel Cup 3 x Per Week/30 Days Secured With: [employer] Sterile or Non-Sterile 6-ply 4.5x4 (yd/yd) 3 x Per Week/30 Days Discharge Instructions: Apply Kerlix as directed 1. I am going to recommend at this point that we have the patient going continue to monitor for any signs of infection or worsening I did go ahead and reapply the total contact cast today and hopefully this should do quite well for him. 2. I am good recommend as well that he not use the black outer boot but just use his walker not putting any pressure  on the cast over the next week and we will see how things do I think this will help the ankle area because it is the slipping into the black cast portion is actually causing issues with his ankle in particular. The patient is also going to look and see if he can find a cheaper knee scooter somewhere I think that would be ideal for him. We will see patient back for reevaluation in 1 week here in the clinic. If anything worsens or changes patient will contact our office for additional recommendations. Electronic Signature(s) Signed: 07/22/2023 10:29:37 AM By: Allen Derry PA-C Entered By: Allen Derry on 07/22/2023 10:29:36 -------------------------------------------------------------------------------- Total Contact Cast Details Patient Name: Date of  Service: [name] 07/22/2023 9:30 A M Medical Record Number: [id number] Patient Account Number: [id number] Date of Birth/Sex: Treating RN: [birth year] (17 y.o. [name] Primary Care Provider: PA Zenovia Jordan, NO Other Clinician: Referring Provider: Treating Provider/Extender: Barnabas Harries Weeks in Treatment: 5 T Contact Cast Applied for Wound Assessment: otal Wound #2 Right Calcaneus Performed By: Physician Allen Derry, PA-C The following information was scribed by: Cato Mulligan ([id number]) [id number] Page 8 of 8 The information was scribed for: Allen Derry Post Procedure Diagnosis Same as Pre-procedure Electronic Signature(s) Signed: 07/22/2023 10:04:26 AM By: Midge Aver MSN RN CNS WTA Signed: 07/22/2023 4:40:04 PM By: Allen Derry PA-C Entered By: Midge Aver on 07/22/2023 10:04:25 -------------------------------------------------------------------------------- SuperBill Details Patient Name: Date of Service: [name] 07/22/2023 Medical Record Number: [id number] Patient Account Number: [id number] Date of Birth/Sex: Treating RN: Feb 08, 2006 (17 y.o. 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 Facility Procedures : CPT4 Code: [id number] Description: 11042 - DEB SUBQ TISSUE 20 SQ CM/< ICD-10 Diagnosis Description L89.613 Pressure ulcer of right heel, stage 3 Modifier: Quantity: 1 Physician Procedures : CPT4 Code Description Modifier 11042 11042 - WC PHYS SUBQ TISS 20 SQ CM ICD-10 Diagnosis Description L89.613 Pressure ulcer of right heel, stage 3 Quantity: 1 Electronic Signature(s) Signed: 07/22/2023 10:35:24 AM By: Midge Aver MSN  RN CNS WTA Signed: 07/22/2023 4:40:04 PM By: Allen Derry PA-C Previous Signature: 07/22/2023 10:29:43 AM Version By: Allen Derry PA-C Entered By: Midge Aver on 07/22/2023 10:35:24

## 2023-07-27 ENCOUNTER — Encounter: Payer: Medicaid Other | Attending: Physician Assistant | Admitting: Physician Assistant

## 2023-07-27 DIAGNOSIS — L89613 Pressure ulcer of right heel, stage 3: Secondary | ICD-10-CM | POA: Insufficient documentation

## 2023-07-27 DIAGNOSIS — Z981 Arthrodesis status: Secondary | ICD-10-CM | POA: Diagnosis not present

## 2023-07-27 DIAGNOSIS — Q0701 Arnold-Chiari syndrome with spina bifida: Secondary | ICD-10-CM | POA: Insufficient documentation

## 2023-07-27 NOTE — Progress Notes (Addendum)
LATERRIAN, MCKERNAN ([id number]) [id number] Page 1 of 8 Visit Report for 07/27/2023 Chief Complaint Document Details Patient Name: Date of Service: [name] 07/27/2023 2:15 PM Medical Record Number: [id number] Patient Account Number: [id number] Date of Birth/Sex: Treating RN: Jun 16, 2006 (17 y.o. [name]) Yevonne Pax Primary Care Provider: PA Zenovia Jordan, NO Other Clinician: Referring Provider: Treating Provider/Extender: Elizebeth Brooking, KRISTA Weeks in Treatment: 5 Information Obtained from: Patient Chief Complaint Pressure ulcer left foot due to brace 06/17/2023; patient is here for review of a pressure area on the right plantar heel Electronic Signature(s) Signed: 07/27/2023 2:07:01 PM By: Allen Derry PA-C Entered By: Allen Derry on 07/27/2023 14:07:00 -------------------------------------------------------------------------------- HPI Details Patient Name: Date of Service: [name] 07/27/2023 2:15 PM Medical Record Number: [id number] Patient Account Number: [id number] Date of Birth/Sex: Treating RN: [birth year] (17 y.o. [name] Primary Care Provider: PA Zenovia Jordan, NO Other Clinician: Referring Provider: Treating Provider/Extender: Elizebeth Brooking, KRISTA Weeks in Treatment: 5 History of Present Illness HPI Description: 01/20/2020 upon evaluation today patient presents for initial inspection here in our clinic concerning issues that he has been having with his left foot laterally at the fifth metatarsal location. It appears that he has issues unfortunately with chronic rubbing on his brace. He does have spina bifida and he has issues with this fairly frequently. With that being said the good news is this does not appear to be infected. The bad news is it is a recurrent issue. He is can be having surgery for foot reconstruction June 8. 01/27/2020 on evaluation today patient appears to be doing about the same in regard to his wound at this  point. Fortunately there is no signs of active infection. Overall I feel like he is actually managing quite well. The wound is very macerated I think this is simply due to the fact that he has been using a Band-Aid as opposed to an absorptive bandage over top of the collagen. He really has nothing to drain into and then been putting over top of this Covan which is occluding it even more. 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 He wears an AFO brace on both feet. Noteworthy that he was here in 2021 for an area on the left foot SHI, BAYERL ([id number]) [id number] Page 2 of 8 He tells me that he has had a wound on the plantar right heel for the better part of a year. Prior to this this may have opened and closed repetitively. He has not been specifically dressing this and he became alarmed when it would not close and also that there may have been an odor noticeable. He is here for a review of this. 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 ABI in our clinic was 1.22 on the right 10/30; x-ray of the right heel was normal no osseous destruction. His PCR culture that I did last week showed E. coli various anaerobes and MSSA. We have been using Hydrofera Blue and a heel offloading boot 07-01-2023 upon evaluation today patient appears to be doing decently well currently in regard to his wound. Fortunately there does not appear to be  any signs of active infection at this time. This is a gentleman whom I have seen previously about 3 years ago. He actually is doing decently well at this point he did have  some debridement last week with Dr. Leanord Hawking he said to have a cast today he will be come back on Friday to change this out. 07-03-2023 upon evaluation today patient appears to be doing well currently in regard to his wound. He has been tolerating the dressing changes without complication. Fortunately there does not appear to be any signs of infection which is good news I think the cast that a great job. He is here for the first cast change to ensure nothing was rubbing the medial part of his ankle was the only place in question he thinks a bigger boot could potentially benefit him there. 07-08-2023 patient's wound currently is showing signs of good improvement I am actually very pleased with where we stand is already measuring smaller and looking better I feel that the depth is filling in and I am very pleased in that regard. I do not see any signs of infection the cast seems to be doing well with this and make sure to pad his ankle area to make sure that nothing is rubbing in this region. 07-16-2023 upon evaluation today patient's heel ulcer has not made quite as much improvement as I like to see although a lot of the callus has loosened up around the edges of the wound has become obvious that I can clear away a lot more this callus and what I previously was able to with the being softer. For that reason I did go ahead and at this point perform debridement clearway necrotic debris and the patient tolerated this today without complication. Postdebridement the wound bed actually appears to be doing much better. 07-22-2023 upon evaluation today patient appears to be doing well currently in regard to his wound although I am a little concerned about how the cast is pushing on the side of his ankle. The biggest issue that I see is simply that it is when his ankle being so why it is put into the boot that it does not quite fit and it therefore pushes a lot inward on that medial ankle. I think that if he can use  his walker and not have to use the black part of the outer boot for his cast that we will take care of the issue and prevent this from causing problems he tells me has been very active doing a lot of walking I think that is been a big part of the issue. 07-27-2023 upon evaluation today patient appears to be doing excellent in regard to his wounds. He has been tolerating the dressing changes without complication. Fortunately there does not appear to be any signs of active infection at this time which is great news and overall there is no need for sharp debridement today. Fortunately the patient seems to be making really good headway here towards closure and in general I think that we are doing extremely well currently. No debridement means we will not make this any larger and in turn I think we should see this be a lot smaller come next week even I am extremely pleased with where things stand. Electronic Signature(s) Signed: 07/27/2023 2:37:08 PM By: Allen Derry PA-C Entered By: Allen Derry on 07/27/2023 14:37:07 -------------------------------------------------------------------------------- Physical Exam Details Patient Name: Date of Service: [name] 07/27/2023 2:15 PM Medical Record Number: [id number]  Patient Account Number: [id number] Date of Birth/Sex: Treating RN: 25-Aug-2006 (17 y.o. [name]) Yevonne Pax Primary Care Provider: PA Zenovia Jordan, NO Other Clinician: Referring Provider: Treating Provider/Extender: Elizebeth Brooking, KRISTA Weeks in Treatment: 5 Constitutional Well-nourished and well-hydrated in no acute distress. Respiratory normal breathing without difficulty. Psychiatric this patient is able to make decisions and demonstrates good insight into disease process. Alert and Oriented x 3. pleasant and cooperative. Notes Upon inspection patient's wound bed actually showed signs of good granulation and epithelization at this point. Fortunately I do not see any evidence  of worsening overall and I feel like the patient is making excellent headway here towards closure very pleased in that regard. ZAIYDEN, BRUMBELOW ([id number]) [id number] Page 3 of 8 Electronic Signature(s) Signed: 07/27/2023 2:37:19 PM By: Allen Derry PA-C Entered By: Allen Derry on 07/27/2023 14:37:18 -------------------------------------------------------------------------------- Physician Orders Details Patient Name: Date of Service: [name] 07/27/2023 2:15 PM Medical Record Number: [id number] Patient Account Number: [id number] Date of Birth/Sex: Treating RN: [birth year] (17 y.o. 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 Bathing/ Shower/ Hygiene Clean wound with Normal Saline or wound cleanser. Anesthetic (Use 'Patient Medications' Section for Anesthetic Order Entry) Lidocaine applied to wound bed Off-Loading Total Contact Cast to Right Lower Extremity Wound Treatment Wound #2 - Calcaneus Wound Laterality: Right Cleanser: Soap and Water 1 x Per Week/30 Days Discharge Instructions: Gently cleanse wound with antibacterial soap, rinse and pat dry prior to dressing wounds Cleanser: Vashe 5.8 (oz) 1 x Per Week/30 Days Discharge Instructions: Use vashe 5.8 (oz) as directed Topical: Gentamicin 1 x Per Week/30 Days Discharge Instructions: Apply as directed by provider. Prim Dressing: Hydrofera Blue Ready Transfer Foam, 2.5x2.5 (in/in) 1 x Per Week/30 Days ary Discharge Instructions: Apply Hydrofera Blue  Ready to wound bed as directed Secondary Dressing: ABD Pad 5x9 (in/in) 1 x Per Week/30 Days Discharge Instructions: heel cup Secondary Dressing: Zetuvit Plus 4x4 (in/in) 1 x Per Week/30 Days Secondary Dressing: foam 1 x Per Week/30 Days Discharge Instructions: to front of foot/ankle Secured With: Kerlix Roll Sterile or Non-Sterile 6-ply 4.5x4 (yd/yd) 1 x Per Week/30 Days Discharge Instructions: Apply Kerlix as directed KURTUS, VIOLET ([id number]) [id number] Page 4 of 8 Electronic Signature(s) Signed: 07/27/2023 3:33:10 PM By: Yevonne Pax RN Signed: 07/27/2023 3:39:27 PM By: Allen Derry PA-C Entered By: Yevonne Pax on 07/27/2023 15:23:09 -------------------------------------------------------------------------------- Problem List Details Patient Name: Date of Service: [name] 07/27/2023 2:15 PM Medical Record Number: [id number] Patient Account Number: [id number] Date of Birth/Sex: Treating RN: [birth year] (17 y.o. 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 [name] Primary Care Provider: PA Zenovia Jordan, [place] Other Clinician: Referring Provider: Treating Provider/Extender:  Elizebeth Brooking, KRISTA Weeks in Treatment: [address] [name] ([id number]) [id number] Page 5 of 8 Chief Complaint Information obtained from Patient Pressure ulcer left foot due to brace 06/17/2023; patient is here for review of a pressure area on the right plantar heel History of Present Illness (HPI) 01/20/2020 upon evaluation today patient presents for initial inspection here in our clinic concerning issues that he has been having with his left foot laterally at the fifth metatarsal location. It appears that he has issues unfortunately with chronic rubbing on his brace. He does have spina bifida and he has issues with this fairly frequently. With that being said the good news is this does not appear to be infected. The bad news is it is a recurrent issue. He is can be having surgery for foot reconstruction June 8. 01/27/2020 on evaluation today patient appears to be doing about the same in regard to his wound at this point. Fortunately there is no signs of active infection. Overall I feel like he is actually managing quite well. The wound is very macerated I think this is simply due to the fact that he has been using a Band-Aid as opposed to an absorptive bandage over top of the collagen. He really has nothing to drain into and then been putting over top of this Covan which is occluding it even more. 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 He wears an AFO brace on both feet. Noteworthy that he was here in 2021 for an area on the left foot He tells me that he has had a wound on the plantar right heel for the better part of a year. Prior to this this may have opened and closed  repetitively. He has not been specifically dressing this and he became alarmed when it would not close and also that there may have been an odor noticeable. He is here for a review of this. 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 ABI in our clinic was 1.22 on the right 10/30; x-ray of the right heel was normal no osseous destruction. His PCR culture that I did last week showed E. coli various anaerobes and MSSA. We have been using Hydrofera Blue and a heel offloading boot 07-01-2023 upon evaluation today patient appears to be doing decently well currently in regard to his wound. Fortunately there does not appear to be any signs of active infection at this time. This is a gentleman whom I have seen previously about 3 years ago. He actually is doing decently well at this point he did have some debridement last week with Dr. Leanord Hawking he said to have a cast today he will be come back on Friday to change this out. 07-03-2023 upon evaluation today patient appears to be doing well currently in regard to his wound. He has been tolerating the dressing changes without complication. Fortunately there does not appear to be any signs of infection which is good news I think the cast that a great job. He is here for the  first cast change to ensure nothing was rubbing the medial part of his ankle was the only place in question he thinks a bigger boot could potentially benefit him there. 07-08-2023 patient's wound currently is showing signs of good improvement I am actually very pleased with where we stand is already measuring smaller and looking better I feel that the depth is filling in and I am very pleased in that regard. I do not see any signs of infection the cast seems to be doing well with this and make sure to pad his ankle area to make sure that nothing is rubbing in this region. 07-16-2023 upon  evaluation today patient's heel ulcer has not made quite as much improvement as I like to see although a lot of the callus has loosened up around the edges of the wound has become obvious that I can clear away a lot more this callus and what I previously was able to with the being softer. For that reason I did go ahead and at this point perform debridement clearway necrotic debris and the patient tolerated this today without complication. Postdebridement the wound bed actually appears to be doing much better. 07-22-2023 upon evaluation today patient appears to be doing well currently in regard to his wound although I am a little concerned about how the cast is pushing on the side of his ankle. The biggest issue that I see is simply that it is when his ankle being so why it is put into the boot that it does not quite fit and it therefore pushes a lot inward on that medial ankle. I think that if he can use his walker and not have to use the black part of the outer boot for his cast that we will take care of the issue and prevent this from causing problems he tells me has been very active doing a lot of walking I think that is been a big part of the issue. 07-27-2023 upon evaluation today patient appears to be doing excellent in regard to his wounds. He has been tolerating the dressing changes without complication. Fortunately there does not appear to be any signs of active infection at this time which is great news and overall there is no need for sharp debridement today. Fortunately the patient seems to be making really good headway here towards closure and in general I think that we are doing extremely well currently. No debridement means we will not make this any larger and in turn I think we should see this be a lot smaller come next week even I am extremely pleased with where things stand. Objective Constitutional Well-nourished and well-hydrated in no acute distress. Vitals Time Taken: 2:14 PM,  Height: 67 in, Weight: 130 lbs, BMI: 20.4, Temperature: 98.5 F, Pulse: 74 bpm, Respiratory Rate: 16 breaths/min, Blood Pressure: 110/60 mmHg. Respiratory normal breathing without difficulty. Psychiatric this patient is able to make decisions and demonstrates good insight into disease process. Alert and Oriented x 3. pleasant and cooperative. General Notes: Upon inspection patient's wound bed actually showed signs of good granulation and epithelization at this point. Fortunately I do not see any evidence of worsening overall and I feel like the patient is making excellent headway here towards closure very pleased in that regard. Integumentary (Hair, Skin) ADREIAN, TACHE ([id number]) [id number] Page 6 of 8 Wound #2 status is Open. Original cause of wound was Pressure Injury. The date acquired was: 06/16/2022. The wound has been in treatment 5 weeks. The wound is located  on the Right Calcaneus. The wound measures 0.5cm length x 0.7cm width x 0.2cm depth; 0.275cm^2 area and 0.055cm^3 volume. There is Fat Layer (Subcutaneous Tissue) exposed. There is no tunneling or undermining noted. There is a medium amount of sanguinous drainage noted. There is small (1-33%) red, pink granulation within the wound bed. There is a medium (34-66%) amount of necrotic tissue within the wound bed. Assessment Active Problems ICD-10 Pressure ulcer of right heel, stage 3 Lumbar spina bifida without hydrocephalus Procedures Wound #2 Pre-procedure diagnosis of Wound #2 is a Pressure Ulcer located on the Right Calcaneus . There was a T Research scientist (life sciences) Procedure by Allen Derry, PA-C. otal Post procedure Diagnosis Wound #2: Same as Pre-Procedure Notes: Cast applied to the right foot without complication today. Plan Follow-up Appointments: Return Appointment in 1 week. Bathing/ Shower/ Hygiene: Clean wound with Normal Saline or wound cleanser. Anesthetic (Use 'Patient Medications' Section for  Anesthetic Order Entry): Lidocaine applied to wound bed Off-Loading: T Contact Cast to Right Lower Extremity otal WOUND #2: - Calcaneus Wound Laterality: Right Cleanser: Soap and Water 3 x Per Week/30 Days Discharge Instructions: Gently cleanse wound with antibacterial soap, rinse and pat dry prior to dressing wounds Cleanser: Vashe 5.8 (oz) 3 x Per Week/30 Days Discharge Instructions: Use vashe 5.8 (oz) as directed Topical: Gentamicin 3 x Per Week/30 Days Discharge Instructions: Apply as directed by provider. Prim Dressing: Hydrofera Blue Ready Transfer Foam, 2.5x2.5 (in/in) 3 x Per Week/30 Days ary Discharge Instructions: Apply Hydrofera Blue Ready to wound bed as directed Secondary Dressing: Zetuvit Plus 4x4 (in/in) 3 x Per Week/30 Days Secondary Dressing: Heel Cup 3 x Per Week/30 Days Secured With: [employer] Sterile or Non-Sterile 6-ply 4.5x4 (yd/yd) 3 x Per Week/30 Days Discharge Instructions: Apply Kerlix as directed 1. I am going to go ahead and recommend that we have the patient continue to monitor for any signs of infection or worsening. I would see if anything changes he knows in contact the office and let me know otherwise we will need to continue with the [facility] which I feel like is doing quite well. 2. I am going to recommend we continue with the Zetuvit to cover followed by the padding for the ankle areas which I feel like is doing quite well as well. We will see patient back for reevaluation in 1 week here in the clinic. If anything worsens or changes patient will contact our office for additional recommendations. Electronic Signature(s) Signed: 07/27/2023 2:39:53 PM By: Allen Derry PA-C Previous Signature: 07/27/2023 2:38:03 PM Version By: Allen Derry PA-C Entered By: Allen Derry on 07/27/2023 14:39:53 [name] ([id number]) [id number] Page 7 of  8 -------------------------------------------------------------------------------- Total Contact Cast Details Patient Name: Date of Service: [name] 07/27/2023 2:15 PM Medical Record Number: [id number] Patient Account Number: [id number] Date of Birth/Sex: Treating RN: Apr 22, 2006 (17 y.o. [name]) Yevonne Pax Primary Care Provider: PA Zenovia Jordan, NO Other Clinician: Referring Provider: Treating Provider/Extender: Elizebeth Brooking, KRISTA Weeks in Treatment: 5 T Contact Cast Applied for Wound Assessment: otal Wound #2 Right Calcaneus Performed By: Physician Allen Derry, PA-C Post Procedure Diagnosis Same as Pre-procedure Notes Cast applied to the right foot without complication today Electronic Signature(s) Signed: 07/27/2023 3:20:29 PM By: Yevonne Pax RN Signed: 07/27/2023 3:39:27 PM By: Allen Derry PA-C Previous Signature: 07/27/2023 2:39:42 PM Version By: Allen Derry PA-C Entered By: Yevonne Pax on 07/27/2023 15:20:29 -------------------------------------------------------------------------------- SuperBill Details Patient Name: Date of Service: [name] 07/27/2023 Medical Record  Number: [id number] Patient Account Number: [id number] Date of Birth/Sex: Treating RN: 25-Apr-2006 (17 y.o. 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

## 2023-07-27 NOTE — Progress Notes (Addendum)
[name] ([id number]) [id number] Page 1 of 9 Visit Report for 07/27/2023 Arrival Information Details Patient Name: Date of Service: [name] 07/27/2023 2:15 PM Medical Record Number: [id number] Patient Account Number: [id number] Date of Birth/Sex: Treating RN: 03-07-2006 (17 y.o. [name]) Yevonne Pax Primary Care Nehemias Sauceda: PA Zenovia Jordan, NO Other Clinician: Referring Octavie Westerhold: Treating Lashawnda Hancox/Extender: Elizebeth Brooking, KRISTA Weeks in Treatment: 5 Visit Information History Since Last Visit Added or deleted any medications: No Patient Arrived: Ambulatory Any Avery allergies or adverse reactions: No Arrival Time: 14:14 Had a fall or experienced change in No Accompanied By: self activities of daily living that may affect Transfer Assistance: None risk of falls: Patient Identification Verified: Yes Signs or symptoms of abuse/neglect since last visito No Secondary Verification Process Completed: Yes Hospitalized since last visit: No Patient Requires Transmission-Based Precautions: No Implantable device outside of the clinic excluding No Patient Has Alerts: No cellular tissue based products placed in the center since last visit: Has Dressing in Place as Prescribed: Yes Has Footwear/Offloading in Place as Prescribed: Yes Left: T Contact Cast otal Pain Present Now: No Electronic Signature(s) Signed: 07/27/2023 3:33:10 PM By: Yevonne Pax RN Entered By: Yevonne Pax on 07/27/2023 14:28:53 -------------------------------------------------------------------------------- Clinic Level of Care Assessment Details Patient Name: Date of Service: [name] 07/27/2023 2:15 PM Medical Record Number: [id number] Patient Account Number: [id number] Date of Birth/Sex: Treating RN: July 03, 2006 (17 y.o. [name]) Yevonne Pax Primary Care Wyn Nettle: PA Zenovia Jordan, NO Other Clinician: Referring Naren Benally: Treating Jaia Alonge/Extender: Elizebeth Brooking, KRISTA Weeks  in Treatment: 5 Clinic Level of Care Assessment Items TOOL 1 Quantity Score []  - 0 Use when EandM and Procedure is performed on INITIAL visit ASSESSMENTS - Nursing Assessment / Reassessment []  - 0 General Physical Exam (combine w/ comprehensive assessment (listed just below) when performed on Avery pt. [address] ([id number]) [id number] Page 2 of 9 []  - 0 Comprehensive Assessment (HX, ROS, Risk Assessments, Wounds Hx, etc.) ASSESSMENTS - Wound and Skin Assessment / Reassessment []  - 0 Dermatologic / Skin Assessment (not related to wound area) ASSESSMENTS - Ostomy and/or Continence Assessment and Care []  - 0 Incontinence Assessment and Management []  - 0 Ostomy Care Assessment and Management (repouching, etc.) PROCESS - Coordination of Care []  - 0 Simple Patient / Family Education for ongoing care []  - 0 Complex (extensive) Patient / Family Education for ongoing care []  - 0 Staff obtains Chiropractor, Records, T Results / Process Orders est []  - 0 Staff telephones HHA, Nursing Homes / Clarify orders / etc []  - 0 Routine Transfer to another Facility (non-emergent condition) []  - 0 Routine Hospital Admission (non-emergent condition) []  - 0 Avery Admissions / Manufacturing engineer / Ordering NPWT Apligraf, etc. , []  - 0 Emergency Hospital Admission (emergent condition) PROCESS - Special Needs []  - 0 Pediatric / Minor Patient Management []  - 0 Isolation Patient Management []  - 0 Hearing / Language / Visual special needs []  - 0 Assessment of Community assistance (transportation, D/C planning, etc.) []  - 0 Additional assistance / Altered mentation []  - 0 Support Surface(s) Assessment (bed, cushion, seat, etc.) INTERVENTIONS - Miscellaneous []  - 0 External ear exam []  - 0 Patient Transfer (multiple staff / Nurse, adult / Similar devices) []  - 0 Simple Staple / Suture removal (25 or less) []  - 0 Complex Staple / Suture removal (26 or  more) []  - 0 Hypo/Hyperglycemic Management (do not check if billed separately) []  - 0 Ankle / Brachial Index (ABI) - do not  check if billed separately Has the patient been seen at the hospital within the last three years: Yes Total Score: 0 Level Of Care: ____ Electronic Signature(s) Signed: 07/27/2023 3:33:10 PM By: Yevonne Pax RN Entered By: Yevonne Pax on 07/27/2023 15:21:31 -------------------------------------------------------------------------------- Encounter Discharge Information Details Patient Name: Date of Service: [name] 07/27/2023 2:15 PM Medical Record Number: [id number] Patient Account Number: [id number] Date of Birth/Sex: Treating RN: 06-28-2006 (17 y.o. [name] Primary Care Aarya Quebedeaux: PA Fidela Juneau Other Clinician: [name] ([id number]) [id number] Page 3 of 9 Referring Britanni Yarde: Treating Aryanah Enslow/Extender: Barnabas Harries Weeks in Treatment: 5 Encounter Discharge Information Items Discharge Condition: Stable Ambulatory Status: Ambulatory Discharge Destination: Home Transportation: Private Auto Accompanied By: self Schedule Follow-up Appointment: Yes Clinical Summary of Care: Electronic Signature(s) Signed: 07/27/2023 3:25:05 PM By: Yevonne Pax RN Previous Signature: 07/27/2023 3:22:05 PM Version By: Yevonne Pax RN Entered By: Yevonne Pax on 07/27/2023 15:25:05 -------------------------------------------------------------------------------- Lower Extremity Assessment Details Patient Name: Date of Service: [name] 07/27/2023 2:15 PM Medical Record Number: [id number] Patient Account Number: [id number] Date of Birth/Sex: Treating RN: [birth year] (17 y.o. [name] Primary Care Evora Schechter: PA Zenovia Jordan, NO Other Clinician: Referring Inga Noller: Treating Therasa Lorenzi/Extender: Elizebeth Brooking, KRISTA Weeks in Treatment: 5 Electronic Signature(s) Signed: 07/27/2023 3:33:10 PM By: Yevonne Pax RN Entered By: Yevonne Pax on 07/27/2023 14:24:41 -------------------------------------------------------------------------------- Multi Wound Chart Details Patient Name: Date of Service: [name] 07/27/2023 2:15 PM Medical Record Number: [id number] Patient Account Number: [id number] Date of Birth/Sex: Treating RN: [birth year] (17 y.o. [name] Primary Care Mosetta Ferdinand: PA TIENT, NO Other Clinician: Referring Mykai Wendorf: Treating Hyla Coard/Extender: Elizebeth Brooking, KRISTA Weeks in Treatment: 5 Vital Signs Height(in): 67 Pulse(bpm): 74 Weight(lbs): 130 Blood Pressure(mmHg): 110/60 Body Mass Index(BMI): 20.4 Temperature(F): 98.5 Respiratory Rate(breaths/min): 16 Bangerter, Cleophas Dunker ([id number]) [phone].pdf Page 4 of 9 [2:Photos:] [N/A:N/A] Right Calcaneus N/A N/A Wound Location: Pressure Injury N/A N/A Wounding Event: Pressure Ulcer N/A N/A Primary Etiology: 06/16/2022 N/A N/A Date Acquired: 5 N/A N/A Weeks of Treatment: Open N/A N/A Wound Status: No N/A N/A Wound Recurrence: 0.6x1x0.2 N/A N/A Measurements L x W x D (cm) 0.471 N/A N/A A (cm) : rea 0.094 N/A N/A Volume (cm) : 42.90% N/A N/A % Reduction in A rea: 81.00% N/A N/A % Reduction in Volume: Category/Stage III N/A N/A Classification: Medium N/A N/A Exudate A mount: Sanguinous N/A N/A Exudate Type: red N/A N/A Exudate Color: Small (1-33%) N/A N/A Granulation A mount: Red, Pink N/A N/A Granulation Quality: Medium (34-66%) N/A N/A Necrotic A mount: Fat Layer (Subcutaneous Tissue): Yes N/A N/A Exposed Structures: Fascia: No Tendon: No Muscle: No Joint: No Bone: No None N/A N/A Epithelialization: Treatment Notes Electronic Signature(s) Signed: 07/27/2023 3:33:10 PM By: Yevonne Pax RN Entered By: Yevonne Pax on 07/27/2023 14:24:45 -------------------------------------------------------------------------------- Multi-Disciplinary Care Plan  Details Patient Name: Date of Service: [name] 07/27/2023 2:15 PM Medical Record Number: [id number] Patient Account Number: [id number] Date of Birth/Sex: Treating RN: 01/26/2006 (17 y.o. [name]) Yevonne Pax Primary Care Meggan Dhaliwal: PA Zenovia Jordan, NO Other Clinician: Referring Daiquan Resnik: Treating Bea Duren/Extender: Elizebeth Brooking, KRISTA Weeks in Treatment: 5 Active Inactive Pressure Nursing Diagnoses: Knowledge deficit related to causes and risk factors for pressure ulcer development Knowledge deficit related to management of pressures ulcers [name] ([id number]) [id number] Page 5 of 9 Potential for impaired tissue integrity related to pressure, friction, moisture, and shear Goals: Patient will remain free from development of additional pressure  ulcers Date Initiated: 06/18/2023 Date Inactivated: 07/22/2023 Target Resolution Date: 07/18/2023 Goal Status: Met Patient will remain free of pressure ulcers Date Initiated: 06/18/2023 Date Inactivated: 07/22/2023 Target Resolution Date: 07/18/2023 Goal Status: Met Patient/caregiver will verbalize risk factors for pressure ulcer development Date Initiated: 06/18/2023 Date Inactivated: 07/22/2023 Target Resolution Date: 07/11/2023 Goal Status: Met Patient/caregiver will verbalize understanding of pressure ulcer management Date Initiated: 06/18/2023 Target Resolution Date: 08/17/2023 Goal Status: Active Interventions: Assess offloading mechanisms upon admission and as needed Assess potential for pressure ulcer upon admission and as needed Provide education on pressure ulcers Notes: Wound/Skin Impairment Nursing Diagnoses: Impaired tissue integrity Knowledge deficit related to ulceration/compromised skin integrity Goals: Patient/caregiver will verbalize understanding of skin care regimen Date Initiated: 06/18/2023 Date Inactivated: 07/22/2023 Target Resolution Date: 07/18/2023 Goal Status:  Met Ulcer/skin breakdown will have a volume reduction of 30% by week 4 Date Initiated: 06/18/2023 Date Inactivated: 07/22/2023 Target Resolution Date: 07/18/2023 Goal Status: Met Ulcer/skin breakdown will have a volume reduction of 50% by week 8 Date Initiated: 06/18/2023 Target Resolution Date: 08/17/2023 Goal Status: Active Ulcer/skin breakdown will have a volume reduction of 80% by week 12 Date Initiated: 06/18/2023 Target Resolution Date: 09/17/2023 Goal Status: Active Ulcer/skin breakdown will heal within 14 weeks Date Initiated: 06/18/2023 Target Resolution Date: 10/01/2023 Goal Status: Active Interventions: Assess patient/caregiver ability to obtain necessary supplies Assess patient/caregiver ability to perform ulcer/skin care regimen upon admission and as needed Assess ulceration(s) every visit Provide education on ulcer and skin care Treatment Activities: Skin care regimen initiated : 06/17/2023 Notes: Electronic Signature(s) Signed: 07/27/2023 3:33:10 PM By: Yevonne Pax RN Entered By: Yevonne Pax on 07/27/2023 14:25:07 [name] ([id number]) [id number] Page 6 of 9 -------------------------------------------------------------------------------- Pain Assessment Details Patient Name: Date of Service: [name] 07/27/2023 2:15 PM Medical Record Number: [id number] Patient Account Number: [id number] Date of Birth/Sex: Treating RN: 09-10-2005 (17 y.o. [name]) Yevonne Pax Primary Care Maricia Scotti: PA Zenovia Jordan, NO Other Clinician: Referring Ricardo Kayes: Treating Aine Strycharz/Extender: Elizebeth Brooking, KRISTA Weeks in Treatment: 5 Active Problems Location of Pain Severity and Description of Pain Patient Has Paino No Site Locations Pain Management and Medication Current Pain Management: Electronic Signature(s) Signed: 07/27/2023 3:33:10 PM By: Yevonne Pax RN Entered By: Yevonne Pax on 07/27/2023  14:15:49 -------------------------------------------------------------------------------- Patient/Caregiver Education Details Patient Name: Date of Service: [name] 12/2/2024andnbsp2:15 PM Medical Record Number: [id number] Patient Account Number: [id number] Date of Birth/Gender: Treating RN: June 29, 2006 (17 y.o. [name] Primary Care Physician: PA Zenovia Jordan, NO Other Clinician: Referring Physician: Treating Physician/Extender: Barnabas Harries Weeks in Treatment: 5 Education Assessment Education Provided To: Patient Education Topics Provided Wound/Skin Impairment: Handouts: Caring for Your Ulcer Methods: Explain/Verbal Responses: State content correctly [name] ([id number]) [phone].pdf Page 7 of 9 Electronic Signature(s) Signed: 07/27/2023 3:33:10 PM By: Yevonne Pax RN Entered By: Yevonne Pax on 07/27/2023 14:25:24 -------------------------------------------------------------------------------- Wound Assessment Details Patient Name: Date of Service: [name] 07/27/2023 2:15 PM Medical Record Number: [id number] Patient Account Number: [id number] Date of Birth/Sex: Treating RN: 2006-06-22 (17 y.o. [name]) Yevonne Pax Primary Care Lavette Yankovich: PA Zenovia Jordan, NO Other Clinician: Referring Brayson Livesey: Treating Delton Stelle/Extender: Elizebeth Brooking, KRISTA Weeks in Treatment: 5 Wound Status Wound Number: 2 Primary Etiology: Pressure Ulcer Wound Location: Right Calcaneus Wound Status: Open Wounding Event: Pressure Injury Date Acquired: 06/16/2022 Weeks Of Treatment: 5 Clustered Wound: No Photos Wound Measurements Length: (cm) 0.5 Width: (cm) 0.7 Depth: (cm) 0.2 Area: (cm) 0.275 Volume: (cm) 0.055 % Reduction in Area: 66.7% %  Reduction in Volume: 88.9% Epithelialization: None Tunneling: No Undermining: No Wound Description Classification: Category/Stage III Exudate Amount: Medium Exudate Type:  Sanguinous Exudate Color: red Foul Odor After Cleansing: No Slough/Fibrino Yes Wound Bed Granulation Amount: Small (1-33%) Exposed Structure Granulation Quality: Red, Pink Fascia Exposed: No Necrotic Amount: Medium (34-66%) Fat Layer (Subcutaneous Tissue) Exposed: Yes Tendon Exposed: No Muscle Exposed: No Joint Exposed: No Bone Exposed: No [name] ([id number]) [phone].pdf Page 8 of 9 Treatment Notes Wound #2 (Calcaneus) Wound Laterality: Right Cleanser Soap and Water Discharge Instruction: Gently cleanse wound with antibacterial soap, rinse and pat dry prior to dressing wounds Vashe 5.8 (oz) Discharge Instruction: Use vashe 5.8 (oz) as directed Peri-Wound Care Topical Gentamicin Discharge Instruction: Apply as directed by Lonya Johannesen. Primary Dressing Hydrofera Blue Ready Transfer Foam, 2.5x2.5 (in/in) Discharge Instruction: Apply Hydrofera Blue Ready to wound bed as directed Secondary Dressing ABD Pad 5x9 (in/in) Discharge Instruction: heel cup Zetuvit Plus 4x4 (in/in) foam Discharge Instruction: to front of foot/ankle Secured With Kerlix Roll Sterile or Non-Sterile 6-ply 4.5x4 (yd/yd) Discharge Instruction: Apply Kerlix as directed Compression Wrap Compression Stockings Add-Ons Electronic Signature(s) Signed: 07/27/2023 3:33:10 PM By: Yevonne Pax RN Signed: 07/27/2023 3:39:27 PM By: Allen Derry PA-C Entered By: Allen Derry on 07/27/2023 14:36:38 -------------------------------------------------------------------------------- Vitals Details Patient Name: Date of Service: [name] 07/27/2023 2:15 PM Medical Record Number: [id number] Patient Account Number: [id number] Date of Birth/Sex: Treating RN: 12-Jul-2006 (17 y.o. [name]) Yevonne Pax Primary Care Abdikadir Fohl: PA Zenovia Jordan, NO Other Clinician: Referring Texanna Hilburn: Treating Nereida Schepp/Extender: Elizebeth Brooking, KRISTA Weeks in Treatment: 5 Vital Signs Time Taken:  14:14 Temperature (F): 98.5 Height (in): 67 Pulse (bpm): 74 Weight (lbs): 130 Respiratory Rate (breaths/min): 16 Body Mass Index (BMI): 20.4 Blood Pressure (mmHg): 110/60 Reference Range: 80 - 120 mg / dl Electronic Signature(s) Signed: 07/27/2023 3:33:10 PM By: Yevonne Pax RN [name] ([id number]) PM By: Yevonne Pax RN [phone].pdf Page 9 of 9 Signed: 07/27/2023 3:33:10 Entered By: Yevonne Pax on 07/27/2023 14:14:59

## 2023-08-03 ENCOUNTER — Ambulatory Visit: Payer: Medicaid Other | Admitting: Physician Assistant

## 2023-08-06 ENCOUNTER — Encounter: Payer: Medicaid Other | Admitting: Physician Assistant

## 2023-08-06 DIAGNOSIS — L89613 Pressure ulcer of right heel, stage 3: Secondary | ICD-10-CM | POA: Diagnosis not present

## 2023-08-06 NOTE — Progress Notes (Addendum)
[name] ([id number]) [id number] Page 1 of 8 Visit Report for 08/06/2023 Chief Complaint Document Details Patient Name: Date of Service: [name] 08/06/2023 12:15 PM Medical Record Number: [id number] Patient Account Number: [id number] Date of Birth/Sex: Treating RN: July 06, 2006 (17 y.o. [name] Primary Care Provider: PA Zenovia Jordan, NO Other Clinician: Referring Provider: Treating Provider/Extender: Elizebeth Brooking, KRISTA Weeks in Treatment: 7 Information Obtained from: Patient Chief Complaint Pressure ulcer left foot due to brace 06/17/2023; patient is here for review of a pressure area on the right plantar heel Electronic Signature(s) Signed: 08/06/2023 12:48:30 PM By: Allen Derry PA-C Entered By: Allen Derry on 08/06/2023 12:48:29 -------------------------------------------------------------------------------- Debridement Details Patient Name: Date of Service: [name] 08/06/2023 12:15 PM Medical Record Number: [id number] Patient Account Number: [id number] Date of Birth/Sex: Treating RN: 2006-04-30 (17 y.o. [name] Primary Care Provider: PA Zenovia Jordan, NO Other Clinician: Referring Provider: Treating Provider/Extender: Barnabas Harries Weeks in Treatment: 7 Debridement Performed for Assessment: Wound #2 Right Calcaneus Performed By: Physician Allen Derry, PA-C The following information was scribed by: Midge Aver The information was scribed for: Allen Derry Debridement Type: Debridement Level of Consciousness (Pre-procedure): Awake and Alert Pre-procedure Verification/Time Out Yes - 13:02 Taken: Start Time: 13:02 Percent of Wound Bed Debrided: 100% T Area Debrided (cm): otal 0.27 Tissue and other material debrided: Viable, Non-Viable, Callus, Slough, Subcutaneous, Slough Level: Skin/Subcutaneous Tissue Debridement Description: Excisional Instrument: Curette Bleeding: Minimum Hemostasis  Achieved: Pressure Procedural Pain: 0 [name] ([id number]) [id number].pdf Page 2 of 8 Post Procedural Pain: 0 Response to Treatment: Procedure was tolerated well Level of Consciousness (Post- Awake and Alert procedure): Post Debridement Measurements of Total Wound Length: (cm) 0.5 Stage: Category/Stage III Width: (cm) 0.7 Depth: (cm) 0.2 Volume: (cm) 0.055 Character of Wound/Ulcer Post Debridement: Stable Post Procedure Diagnosis Same as Pre-procedure Electronic Signature(s) Signed: 08/06/2023 4:38:12 PM By: Allen Derry PA-C Signed: 08/06/2023 5:08:46 PM By: Midge Aver MSN RN CNS WTA Entered By: Midge Aver on 08/06/2023 13:04:43 -------------------------------------------------------------------------------- HPI Details Patient Name: Date of Service: [name] 08/06/2023 12:15 PM Medical Record Number: [id number] Patient Account Number: [id number] Date of Birth/Sex: Treating RN: 2005/12/03 (17 y.o. [name] Primary Care Provider: PA Zenovia Jordan, NO Other Clinician: Referring Provider: Treating Provider/Extender: Elizebeth Brooking, KRISTA Weeks in Treatment: 7 History of Present Illness HPI Description: 01/20/2020 upon evaluation today patient presents for initial inspection here in our clinic concerning issues that he has been having with his left foot laterally at the fifth metatarsal location. It appears that he has issues unfortunately with chronic rubbing on his brace. He does have spina bifida and he has issues with this fairly frequently. With that being said the good news is this does not appear to be infected. The bad news is it is a recurrent issue. He is can be having surgery for foot reconstruction June 8. 01/27/2020 on evaluation today patient appears to be doing about the same in regard to his wound at this point. Fortunately there is no signs of active infection. Overall I feel like he is actually managing quite well.  The wound is very macerated I think this is simply due to the fact that he has been using a Band-Aid as opposed to an absorptive bandage over top of the collagen. He really has nothing to drain into and then been putting over top of this Covan which is occluding it even more. 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 He wears an AFO brace on both feet. Noteworthy that he was here in 2021 for an area on the left foot He tells me that he has had a wound on the plantar right heel for the better part of a year. Prior to this this may have opened and closed repetitively. He has not been specifically dressing this and he became alarmed when it would not close and also that there may have been an odor noticeable. He is here for a review of this. 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 ABI in our clinic was 1.22 on the right 10/30; x-ray of the right heel was normal no osseous destruction. His PCR culture that I did last week showed E. coli various anaerobes and MSSA. We have been using Hydrofera Blue and a heel offloading boot 07-01-2023 upon evaluation today patient appears to be doing decently well currently in regard to his wound. Fortunately there does not appear to be any signs of active infection at this time. This is a gentleman whom I have seen previously about 3 years ago. He actually is doing decently well at this point he did have some debridement last week with Dr. Leanord Hawking he said to have a cast today he will be come back on Friday to change this out. 07-03-2023 upon evaluation today patient appears to be doing well  currently in regard to his wound. He has been tolerating the dressing changes without complication. Fortunately there does not appear to be any signs of infection which is good news I think the cast that a great job. He is here for the first cast KRISHIV, ROESSLER ([id number]) [id number] Page 3 of 8 change to ensure nothing was rubbing the medial part of his ankle was the only place in question he thinks a bigger boot could potentially benefit him there. 07-08-2023 patient's wound currently is showing signs of good improvement I am actually very pleased with where we stand is already measuring smaller and looking better I feel that the depth is filling in and I am very pleased in that regard. I do not see any signs of infection the cast seems to be doing well with this and make sure to pad his ankle area to make sure that nothing is rubbing in this region. 07-16-2023 upon evaluation today patient's heel ulcer has not made quite as much improvement as I like to see although a lot of the callus has loosened up around the edges of the wound has become obvious that I can clear away a lot more this callus and what I previously was able to with the being softer. For that reason I did go ahead and at this point perform debridement clearway necrotic debris and the patient tolerated this today without complication. Postdebridement the wound bed actually appears to be doing much better. 07-22-2023 upon evaluation today patient appears to be doing well currently in regard to his wound although I am a little concerned about how the cast is pushing on the side of his ankle. The biggest issue that I see is simply that it is when his ankle being so why it is put into  the boot that it does not quite fit and it therefore pushes a lot inward on that medial ankle. I think that if he can use his walker and not have to use the black part of the outer boot for his cast that we will take care of the  issue and prevent this from causing problems he tells me has been very active doing a lot of walking I think that is been a big part of the issue. 07-27-2023 upon evaluation today patient appears to be doing excellent in regard to his wounds. He has been tolerating the dressing changes without complication. Fortunately there does not appear to be any signs of active infection at this time which is great news and overall there is no need for sharp debridement today. Fortunately the patient seems to be making really good headway here towards closure and in general I think that we are doing extremely well currently. No debridement means we will not make this any larger and in turn I think we should see this be a lot smaller come next week even I am extremely pleased with where things stand. 08-06-2023 upon evaluation today patient appears to be doing well currently in regard to his wound which is showing signs of improvement this is still slowed going but nonetheless is a little bit smaller than it was last week it definitely looks much better than it was last week. Electronic Signature(s) Signed: 08/06/2023 1:11:48 PM By: Allen Derry PA-C Entered By: Allen Derry on 08/06/2023 13:11:48 -------------------------------------------------------------------------------- Physical Exam Details Patient Name: Date of Service: [name] 08/06/2023 12:15 PM Medical Record Number: [id number] Patient Account Number: [id number] Date of Birth/Sex: Treating RN: 11/12/2005 (17 y.o. [name] Primary Care Provider: PA Zenovia Jordan, NO Other Clinician: Referring Provider: Treating Provider/Extender: Elizebeth Brooking, KRISTA Weeks in Treatment: 7 Constitutional Well-nourished and well-hydrated in no acute distress. Respiratory normal breathing without difficulty. Psychiatric this patient is able to make decisions and demonstrates good insight into disease process. Alert and Oriented x 3. pleasant and  cooperative. 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 With that being said I think that this is a ongoing concern I am a little worried that we continue the casting we are going to run the risk here. He is also having trouble making sure that he is here for his appointments on time and regularly. Has had a lot of issues,. I think this makes it increasingly concerning to continue the cast and we both agree that this is probably not the best way to go he is gena use an offloading shoe and actually use his walker to try to stay off of this as much as possible. Electronic Signature(s) Signed: 08/06/2023 1:12:40 PM By: Allen Derry PA-C Entered By: Allen Derry on 08/06/2023 13:12:39 [name] ([id number]) [id number] Page 4 of 8 -------------------------------------------------------------------------------- Physician Orders Details Patient Name: Date of Service: [name] 08/06/2023 12:15 PM Medical Record Number: [id number] Patient Account Number: [id number] Date of Birth/Sex: Treating RN: [birth year] (17 y.o. 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 Bathing/ Shower/ Hygiene Clean wound with Normal Saline or wound cleanser. Anesthetic (Use 'Patient Medications' Section for Anesthetic Order Entry) Lidocaine applied to wound bed Off-Loading Heel suspension boot Wound  Treatment Wound #2 - Calcaneus Wound Laterality: Right Cleanser: Soap and Water 1 x Per Week/30 Days Discharge Instructions: Gently cleanse wound with antibacterial soap, rinse and pat dry prior to dressing wounds Cleanser: Vashe 5.8 (oz) 1 x Per Week/30 Days Discharge Instructions: Use vashe 5.8 (oz) as directed Topical: Gentamicin 1 x Per Week/30 Days Discharge Instructions: Apply as directed by provider. Prim Dressing: Hydrofera Blue Ready Transfer Foam, 2.5x2.5 (in/in) 1 x Per Week/30 Days ary Discharge Instructions: Apply Hydrofera Blue Ready to wound bed as directed Secondary Dressing: ABD Pad 5x9 (in/in) 1 x Per Week/30 Days Discharge Instructions: heel cup Secured With: Kerlix Roll Sterile or Non-Sterile 6-ply 4.5x4 (yd/yd) 1 x Per Week/30 Days Discharge Instructions: Apply Kerlix as directed Electronic Signature(s) Signed: 08/06/2023 4:38:12 PM By: Allen Derry PA-C Signed: 08/06/2023 5:08:46 PM By: Midge Aver MSN RN CNS WTA Entered By: Midge Aver on 08/06/2023 13:24:15 [name] ([id number]) [id number] Page 5 of 8 -------------------------------------------------------------------------------- Problem List Details Patient Name: Date of Service: [name] 08/06/2023 12:15 PM Medical Record Number: [id number] Patient Account Number: [id number] Date of Birth/Sex: Treating RN: 2005/12/16 (17 y.o. 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, stage 3 06/17/2023 No Yes Q05.7 Lumbar spina bifida without hydrocephalus 06/17/2023 No Yes Inactive Problems Resolved Problems Electronic Signature(s) Signed: 08/06/2023 4:38:12 PM By: Allen Derry PA-C Signed: 08/06/2023 5:08:46 PM By: Midge Aver MSN  RN CNS WTA Previous Signature: 08/06/2023 12:48:21 PM Version By: Allen Derry PA-C Entered By: Midge Aver on 08/06/2023 13:25:02 -------------------------------------------------------------------------------- Progress Note Details Patient Name: Date of Service: [name] 08/06/2023 12:15 PM Medical Record Number: [id number] Patient Account Number: [id number] Date of Birth/Sex: Treating RN: 2006/04/21 (17 y.o. [name] Primary Care Provider: PA Zenovia Jordan, NO Other Clinician: Referring Provider: Treating Provider/Extender: Barnabas Harries Weeks in Treatment: 7 Subjective Chief Complaint Information obtained from Patient Pressure ulcer left foot due to brace 06/17/2023; patient is here for review of a pressure area on the right plantar heel [name] ([id number]) [id number] Page 6 of 8 History of Present Illness (HPI) 01/20/2020 upon evaluation today patient presents for initial inspection here in our clinic concerning issues that he has been having with his left foot laterally at the fifth metatarsal location. It appears that he has issues unfortunately with chronic rubbing on his brace. He does have spina bifida and he has issues with this fairly frequently. With that being said the good news is this does not appear to be infected. The bad news is it is a recurrent issue. He is can be having surgery for foot reconstruction June 8. 01/27/2020 on evaluation today patient appears to be doing about the same in regard to his wound at this point. Fortunately there is no signs of active infection. Overall I feel like he is actually managing quite well. The wound is very macerated I think this is simply due to the fact that he has been using a Band-Aid as opposed to an absorptive bandage over top of the collagen. He really has nothing  to drain into and then been putting over top of this Covan which is occluding it even  more. 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 He wears an AFO brace on both feet. Noteworthy that he was here in 2021 for an area on the left foot He tells me that he has had a wound on the plantar right heel for the better part of a year. Prior to this this may have opened and closed repetitively. He has not been specifically dressing this and he became alarmed when it would not close and also that there may have been an odor noticeable. He is here for a review of this. 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 ABI in our clinic was 1.22 on the right 10/30; x-ray of the right heel was normal no osseous destruction. His PCR culture that I did last week showed E. coli various anaerobes and MSSA. We have been using Hydrofera Blue and a heel offloading boot 07-01-2023 upon evaluation today patient appears to be doing decently well currently in regard to his wound. Fortunately there does not appear to be any signs of active infection at this time. This is a gentleman whom I have seen previously about 3 years ago. He actually is doing decently well at this point he did have some debridement last week with Dr. Leanord Hawking he said to have a cast today he will be come back on Friday to change this out. 07-03-2023 upon evaluation today patient appears to be doing well currently in regard to his wound. He has been tolerating the dressing changes without complication. Fortunately there does not appear to be any signs of infection which is good news I think the cast that a great job. He is here for the first cast change to ensure nothing was  rubbing the medial part of his ankle was the only place in question he thinks a bigger boot could potentially benefit him there. 07-08-2023 patient's wound currently is showing signs of good improvement I am actually very pleased with where we stand is already measuring smaller and looking better I feel that the depth is filling in and I am very pleased in that regard. I do not see any signs of infection the cast seems to be doing well with this and make sure to pad his ankle area to make sure that nothing is rubbing in this region. 07-16-2023 upon evaluation today patient's heel ulcer has not made quite as much improvement as I like to see although a lot of the callus has loosened up around the edges of the wound has become obvious that I can clear away a lot more this callus and what I previously was able to with the being softer. For that reason I did go ahead and at this point perform debridement clearway necrotic debris and the patient tolerated this today without complication. Postdebridement the wound bed actually appears to be doing much better. 07-22-2023 upon evaluation today patient appears to be doing well currently in regard to his wound although I am a little concerned about how the cast is pushing on the side of his ankle. The biggest issue  that I see is simply that it is when his ankle being so why it is put into the boot that it does not quite fit and it therefore pushes a lot inward on that medial ankle. I think that if he can use his walker and not have to use the black part of the outer boot for his cast that we will take care of the issue and prevent this from causing problems he tells me has been very active doing a lot of walking I think that is been a big part of the issue. 07-27-2023 upon evaluation today patient appears to be doing excellent in regard to his wounds. He has been tolerating the dressing changes without complication. Fortunately there does not appear to be any  signs of active infection at this time which is great news and overall there is no need for sharp debridement today. Fortunately the patient seems to be making really good headway here towards closure and in general I think that we are doing extremely well currently. No debridement means we will not make this any larger and in turn I think we should see this be a lot smaller come next week even I am extremely pleased with where things stand. 08-06-2023 upon evaluation today patient appears to be doing well currently in regard to his wound which is showing signs of improvement this is still slowed going but nonetheless is a little bit smaller than it was last week it definitely looks much better than it was last week. Objective Constitutional Well-nourished and well-hydrated in no acute distress. Vitals Time Taken: 12:42 PM, Height: 67 in, Weight: 130 lbs, BMI: 20.4, Temperature: 98.1 F, Pulse: 61 bpm, Respiratory Rate: 16 breaths/min, Blood Pressure: 109/60 mmHg. Respiratory normal breathing without difficulty. Psychiatric this patient is able to make decisions and demonstrates good insight into disease process. Alert and Oriented x 3. pleasant and cooperative. 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 With that being said I think that this is a ongoing concern I am a little worried that we continue the casting we are going to run the risk here. He is also having trouble making sure that he is here for his appointments on time and regularly. Has had a lot of issues,. I think this makes it increasingly concerning to continue the cast and we both agree that this is probably not the best way to go he is gena use an offloading shoe and actually use his walker to try to stay off of this as much as possible. [name] ([id number])  [id number] Page 7 of 8 Integumentary (Hair, Skin) Wound #2 status is Open. Original cause of wound was Pressure Injury. The date acquired was: 06/16/2022. The wound has been in treatment 7 weeks. The wound is located on the Right Calcaneus. The wound measures 0.5cm length x 0.7cm width x 0.2cm depth; 0.275cm^2 area and 0.055cm^3 volume. There is Fat Layer (Subcutaneous Tissue) exposed. There is a medium amount of sanguinous drainage noted. There is small (1-33%) red, pink granulation within the wound bed. There is a medium (34-66%) amount of necrotic tissue within the wound bed. Assessment Active Problems ICD-10 Pressure ulcer of right heel, stage 3 Lumbar spina bifida without hydrocephalus Procedures Wound #2 Pre-procedure diagnosis of Wound #2 is a Pressure Ulcer located on the Right Calcaneus . There was a Excisional Skin/Subcutaneous Tissue Debridement with a  total area of 0.27 sq cm performed by Allen Derry, PA-C. With the following instrument(s): Curette to remove Viable and Non-Viable tissue/material. Material removed includes Callus, Subcutaneous Tissue, and Slough. No specimens were taken. A time out was conducted at 13:02, prior to the start of the procedure. A Minimum amount of bleeding was controlled with Pressure. The procedure was tolerated well with a pain level of 0 throughout and a pain level of 0 following the procedure. Post Debridement Measurements: 0.5cm length x 0.7cm width x 0.2cm depth; 0.055cm^3 volume. Post debridement Stage noted as Category/Stage III. Character of Wound/Ulcer Post Debridement is stable. Post procedure Diagnosis Wound #2: Same as Pre-Procedure Plan 1. I am going to recommend that we have the patient going continue to utilize the [facility] I think this is still the best way to go. 2. I will continue with ABD pad and roll gauze to secure in place. 3. I am also going to recommend he change this 3 times per week. 4. I  would also recommend that he needs to offload is much as possible making sure to keep his foot up and elevated is much as he can and then when he is up and about using the walker so he is not putting pressure on the heel. We will see patient back for reevaluation in 1 week here in the clinic. If anything worsens or changes patient will contact our office for additional recommendations. Electronic Signature(s) Signed: 08/06/2023 1:13:28 PM By: Allen Derry PA-C Entered By: Allen Derry on 08/06/2023 13:13:28 -------------------------------------------------------------------------------- SuperBill Details Patient Name: Date of Service: [name] 08/06/2023 Medical Record Number: [id number] Patient Account Number: [id number] Date of Birth/Sex: Treating RN: 02/16/2006 (17 y.o. 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 Facility Procedures : CPT4 Code: 52841324 Description: 11042 - DEB SUBQ TISSUE 20 SQ CM/< ICD-10 Diagnosis Description L89.613 Pressure ulcer of right heel, stage 3 Modifier: Quantity: 1 Physician Procedures : CPT4 Code Description Modifier 11042 11042 - WC PHYS SUBQ TISS 20 SQ CM ICD-10 Diagnosis Description L89.613 Pressure ulcer of right heel, stage 3 Quantity: 1 Electronic Signature(s) Signed: 08/06/2023 4:38:12 PM By: Allen Derry PA-C Signed: 08/06/2023 5:08:46 PM By: Midge Aver MSN RN CNS WTA Previous Signature: 08/06/2023 1:13:41 PM Version By: Allen Derry PA-C Entered By: Midge Aver on 08/06/2023 13:24:28

## 2023-08-07 NOTE — Progress Notes (Signed)
[name] ([id number]) [id number] Page 1 of 9 Visit Report for 08/06/2023 Arrival Information Details Patient Name: Date of Service: [name] 08/06/2023 12:15 PM Medical Record Number: [id number] Patient Account Number: [id number] Date of Birth/Sex: Treating RN: February 22, 2006 (17 y.o. [name] Primary Care Yvonna Brun: PA Zenovia Jordan, NO Other Clinician: Referring Joline Encalada: Treating Levern Pitter/Extender: Barnabas Harries Weeks in Treatment: 7 Visit Information History Since Last Visit Added or deleted any medications: No Patient Arrived: Ambulatory Any Avery allergies or adverse reactions: No Arrival Time: 12:41 Had a fall or experienced change in No Accompanied By: self activities of daily living that may affect Transfer Assistance: None risk of falls: Patient Identification Verified: Yes Hospitalized since last visit: No Secondary Verification Process Completed: Yes Has Dressing in Place as Prescribed: Yes Patient Requires Transmission-Based Precautions: No Has Footwear/Offloading in Place as Prescribed: Yes Patient Has Alerts: No Right: T Contact Cast otal Pain Present Now: No Electronic Signature(s) Signed: 08/06/2023 5:08:46 PM By: Midge Aver MSN RN CNS WTA Entered By: Midge Aver on 08/06/2023 12:41:41 -------------------------------------------------------------------------------- Clinic Level of Care Assessment Details Patient Name: Date of Service: [name] 08/06/2023 12:15 PM Medical Record Number: [id number] Patient Account Number: [id number] Date of Birth/Sex: Treating RN: 2005/11/10 (17 y.o. [name] Primary Care Remi Rester: PA Zenovia Jordan, NO Other Clinician: Referring Lenoria Narine: Treating Kaiyah Eber/Extender: Elizebeth Brooking, KRISTA Weeks in Treatment: 7 Clinic Level of Care Assessment Items TOOL 1 Quantity Score []  - 0 Use when EandM and Procedure is performed on INITIAL visit ASSESSMENTS -  Nursing Assessment / Reassessment []  - 0 General Physical Exam (combine w/ comprehensive assessment (listed just below) when performed on Avery pt. evals) []  - 0 Comprehensive Assessment (HX, ROS, Risk Assessments, Wounds Hx, etc.) ASSESSMENTS - Wound and Skin Assessment / Reassessment []  - 0 Dermatologic / Skin Assessment (not related to wound area) [name] ([id number]) [id number] Page 2 of 9 ASSESSMENTS - Ostomy and/or Continence Assessment and Care []  - 0 Incontinence Assessment and Management []  - 0 Ostomy Care Assessment and Management (repouching, etc.) PROCESS - Coordination of Care []  - 0 Simple Patient / Family Education for ongoing care []  - 0 Complex (extensive) Patient / Family Education for ongoing care []  - 0 Staff obtains Chiropractor, Records, T Results / Process Orders est []  - 0 Staff telephones HHA, Nursing Homes / Clarify orders / etc []  - 0 Routine Transfer to another Facility (non-emergent condition) []  - 0 Routine Hospital Admission (non-emergent condition) []  - 0 Avery Admissions / Manufacturing engineer / Ordering NPWT Apligraf, etc. , []  - 0 Emergency Hospital Admission (emergent condition) PROCESS - Special Needs []  - 0 Pediatric / Minor Patient Management []  - 0 Isolation Patient Management []  - 0 Hearing / Language / Visual special needs []  - 0 Assessment of Community assistance (transportation, D/C planning, etc.) []  - 0 Additional assistance / Altered mentation []  - 0 Support Surface(s) Assessment (bed, cushion, seat, etc.) INTERVENTIONS - Miscellaneous []  - 0 External ear exam []  - 0 Patient Transfer (multiple staff / Nurse, adult / Similar devices) []  - 0 Simple Staple / Suture removal (25 or less) []  - 0 Complex Staple / Suture removal (26 or more) []  - 0 Hypo/Hyperglycemic Management (do not check if billed separately) []  - 0 Ankle / Brachial Index (ABI) - do not check if billed separately Has the  patient been seen at the hospital within the last three years: Yes Total Score: 0 Level Of Care: ____  Electronic Signature(s) Signed: 08/06/2023 5:08:46 PM By: Midge Aver MSN RN CNS WTA Entered By: Midge Aver on 08/06/2023 13:24:21 -------------------------------------------------------------------------------- Encounter Discharge Information Details Patient Name: Date of Service: [name] 08/06/2023 12:15 PM Medical Record Number: [id number] Patient Account Number: [id number] Date of Birth/Sex: Treating RN: Jul 28, 2006 (17 y.o. [name] Primary Care Anahid Eskelson: PA Zenovia Jordan, NO Other Clinician: Referring Diyari Cherne: Treating Staphany Ditton/Extender: Barnabas Harries Weeks in Treatment: 7 Encounter Discharge Information Items Post Procedure [name], [name] ([id number]) [id number] Page 3 of 9 Discharge Condition: Stable Temperature (F): 98.1 Ambulatory Status: Ambulatory Pulse (bpm): 61 Discharge Destination: Home Respiratory Rate (breaths/min): 16 Transportation: Private Auto Blood Pressure (mmHg): 109/60 Accompanied By: self Schedule Follow-up Appointment: Yes Clinical Summary of Care: Electronic Signature(s) Signed: 08/06/2023 5:08:46 PM By: Midge Aver MSN RN CNS WTA Entered By: Midge Aver on 08/06/2023 13:26:49 -------------------------------------------------------------------------------- Lower Extremity Assessment Details Patient Name: Date of Service: [name] 08/06/2023 12:15 PM Medical Record Number: [id number] Patient Account Number: [id number] Date of Birth/Sex: Treating RN: February 27, 2006 (17 y.o. [name] Primary Care Tyler Cubit: PA Zenovia Jordan, NO Other Clinician: Referring Dayami Taitt: Treating Soleia Badolato/Extender: Barnabas Harries Weeks in Treatment: 7 Electronic Signature(s) Signed: 08/06/2023 5:08:46 PM By: Midge Aver MSN RN CNS WTA Entered By: Midge Aver on 08/06/2023  13:05:12 -------------------------------------------------------------------------------- Multi Wound Chart Details Patient Name: Date of Service: [name] 08/06/2023 12:15 PM Medical Record Number: [id number] Patient Account Number: [id number] Date of Birth/Sex: Treating RN: [birth year] (17 y.o. [name] Primary Care Chandan Fly: PA TIENT, NO Other Clinician: Referring Trenace Coughlin: Treating Abdelaziz Westenberger/Extender: Elizebeth Brooking, KRISTA Weeks in Treatment: 7 Vital Signs Height(in): 67 Pulse(bpm): 61 Weight(lbs): 130 Blood Pressure(mmHg): 109/60 Body Mass Index(BMI): 20.4 Temperature(F): 98.1 Respiratory Rate(breaths/min): 16 [2:Photos:] [N/A:N/A] Right Calcaneus N/A N/A Wound Location: Pressure Injury N/A N/A Wounding Event: Pressure Ulcer N/A N/A Primary Etiology: 06/16/2022 N/A N/A Date Acquired: 7 N/A N/A Weeks of Treatment: Open N/A N/A Wound Status: No N/A N/A Wound Recurrence: 0.5x0.7x0.2 N/A N/A Measurements L x W x D (cm) 0.275 N/A N/A A (cm) : rea 0.055 N/A N/A Volume (cm) : 66.70% N/A N/A % Reduction in A rea: 88.90% N/A N/A % Reduction in Volume: Category/Stage III N/A N/A Classification: Medium N/A N/A Exudate A mount: Sanguinous N/A N/A Exudate Type: red N/A N/A Exudate Color: Small (1-33%) N/A N/A Granulation A mount: Red, Pink N/A N/A Granulation Quality: Medium (34-66%) N/A N/A Necrotic A mount: Fat Layer (Subcutaneous Tissue): Yes N/A N/A Exposed Structures: Fascia: No Tendon: No Muscle: No Joint: No Bone: No None N/A N/A Epithelialization: Debridement - Excisional N/A N/A Debridement: Pre-procedure Verification/Time Out 13:02 N/A N/A Taken: Callus, Subcutaneous, Slough N/A N/A Tissue Debrided: Skin/Subcutaneous Tissue N/A N/A Level: 0.27 N/A N/A Debridement A (sq cm): rea Curette N/A N/A Instrument: Minimum N/A N/A Bleeding: Pressure N/A N/A Hemostasis A chieved: 0 N/A N/A Procedural Pain: 0 N/A  N/A Post Procedural Pain: Procedure was tolerated well N/A N/A Debridement Treatment Response: 0.5x0.7x0.2 N/A N/A Post Debridement Measurements L x W x D (cm) 0.055 N/A N/A Post Debridement Volume: (cm) Category/Stage III N/A N/A Post Debridement Stage: Debridement N/A N/A Procedures Performed: Treatment Notes Electronic Signature(s) Signed: 08/06/2023 5:08:46 PM By: Midge Aver MSN RN CNS WTA Entered By: Midge Aver on 08/06/2023 13:05:20 -------------------------------------------------------------------------------- Multi-Disciplinary Care Plan Details Patient Name: Date of Service: [name] 08/06/2023 12:15 PM Medical Record Number: [id number] Patient Account Number: [id number] Date of  Birth/Sex: Treating RN: [birth year] (17 y.o. [name] Primary Care Gabriella Woodhead: PA Zenovia Jordan, NO Other Clinician: Referring Eliyas Suddreth: Treating [place]/Extender: Barnabas Harries Weeks in Treatment: 7 [name], [name] ([id number]) [id number] Page 5 of 9 Active Inactive Pressure Nursing Diagnoses: Knowledge deficit related to causes and risk factors for pressure ulcer development Knowledge deficit related to management of pressures ulcers Potential for impaired tissue integrity related to pressure, friction, moisture, and shear Goals: Patient will remain free from development of additional pressure ulcers Date Initiated: 06/18/2023 Date Inactivated: 07/22/2023 Target Resolution Date: 07/18/2023 Goal Status: Met Patient will remain free of pressure ulcers Date Initiated: 06/18/2023 Date Inactivated: 07/22/2023 Target Resolution Date: 07/18/2023 Goal Status: Met Patient/caregiver will verbalize risk factors for pressure ulcer development Date Initiated: 06/18/2023 Date Inactivated: 07/22/2023 Target Resolution Date: 07/11/2023 Goal Status: Met Patient/caregiver will verbalize understanding of pressure ulcer management Date Initiated:  06/18/2023 Target Resolution Date: 08/17/2023 Goal Status: Active Interventions: Assess offloading mechanisms upon admission and as needed Assess potential for pressure ulcer upon admission and as needed Provide education on pressure ulcers Notes: Wound/Skin Impairment Nursing Diagnoses: Impaired tissue integrity Knowledge deficit related to ulceration/compromised skin integrity Goals: Patient/caregiver will verbalize understanding of skin care regimen Date Initiated: 06/18/2023 Date Inactivated: 07/22/2023 Target Resolution Date: 07/18/2023 Goal Status: Met Ulcer/skin breakdown will have a volume reduction of 30% by week 4 Date Initiated: 06/18/2023 Date Inactivated: 07/22/2023 Target Resolution Date: 07/18/2023 Goal Status: Met Ulcer/skin breakdown will have a volume reduction of 50% by week 8 Date Initiated: 06/18/2023 Target Resolution Date: 08/17/2023 Goal Status: Active Ulcer/skin breakdown will have a volume reduction of 80% by week 12 Date Initiated: 06/18/2023 Target Resolution Date: 09/17/2023 Goal Status: Active Ulcer/skin breakdown will heal within 14 weeks Date Initiated: 06/18/2023 Target Resolution Date: 10/01/2023 Goal Status: Active Interventions: Assess patient/caregiver ability to obtain necessary supplies Assess patient/caregiver ability to perform ulcer/skin care regimen upon admission and as needed Assess ulceration(s) every visit Provide education on ulcer and skin care Treatment Activities: Skin care regimen initiated : 06/17/2023 Notes: Electronic Signature(s) Signed: 08/06/2023 5:08:46 PM By: Midge Aver MSN RN CNS WTA Entered By: Midge Aver on 08/06/2023 13:24:37 [name] ([id number]) [id number] Page 6 of 9 -------------------------------------------------------------------------------- Pain Assessment Details Patient Name: Date of Service: [name] 08/06/2023 12:15 PM Medical Record Number:  [id number] Patient Account Number: [id number] Date of Birth/Sex: Treating RN: 02-02-2006 (17 y.o. [name] Primary Care Doni Widmer: PA Zenovia Jordan, NO Other Clinician: Referring Efstathios Sawin: Treating Vester Balthazor/Extender: Elizebeth Brooking, KRISTA Weeks in Treatment: 7 Active Problems Location of Pain Severity and Description of Pain Patient Has Paino No Site Locations Pain Management and Medication Current Pain Management: Electronic Signature(s) Signed: 08/06/2023 5:08:46 PM By: Midge Aver MSN RN CNS WTA Entered By: Midge Aver on 08/06/2023 12:43:20 -------------------------------------------------------------------------------- Patient/Caregiver Education Details Patient Name: Date of Service: [name] 12/12/2024andnbsp12:15 PM Medical Record Number: [id number] Patient Account Number: [id number] Date of Birth/Gender: Treating RN: August 02, 2006 (17 y.o. [name] Primary Care Physician: PA Zenovia Jordan, NO Other Clinician: Referring Physician: Treating Physician/Extender: Barnabas Harries Weeks in Treatment: 7 [place], [name] ([id number]) [id number] Page 7 of 9 Education Assessment Education Provided To: Patient Education Topics Provided Wound/Skin Impairment: Handouts: Caring for Your Ulcer Methods: Explain/Verbal Responses: State content correctly Electronic Signature(s) Signed: 08/06/2023 5:08:46 PM By: Midge Aver MSN RN CNS WTA Entered By: Midge Aver on 08/06/2023 13:24:52 -------------------------------------------------------------------------------- Wound Assessment Details Patient Name: Date of Service: [name] 08/06/2023 12:15 PM  Medical Record Number: [id number] Patient Account Number: [id number] Date of Birth/Sex: Treating RN: 2005/11/10 (17 y.o. [name] Primary Care Chereese Cilento: PA TIENT, NO Other Clinician: Referring Nivedita Mirabella: Treating Dorianna Mckiver/Extender: Elizebeth Brooking, KRISTA Weeks in  Treatment: 7 Wound Status Wound Number: 2 Primary Etiology: Pressure Ulcer Wound Location: Right Calcaneus Wound Status: Open Wounding Event: Pressure Injury Date Acquired: 06/16/2022 Weeks Of Treatment: 7 Clustered Wound: No Photos Wound Measurements Length: (cm) 0.5 Width: (cm) 0.7 Depth: (cm) 0.2 Area: (cm) 0.275 Volume: (cm) 0.055 % Reduction in Area: 66.7% % Reduction in Volume: 88.9% Epithelialization: None Wound Description Classification: Category/Stage III Exudate Amount: Medium Exudate Type: Sanguinous Autrey, [name] ([id number]) Exudate Color: red Foul Odor After Cleansing: No Slough/Fibrino Yes [phone].pdf Page 8 of 9 Wound Bed Granulation Amount: Small (1-33%) Exposed Structure Granulation Quality: Red, Pink Fascia Exposed: No Necrotic Amount: Medium (34-66%) Fat Layer (Subcutaneous Tissue) Exposed: Yes Tendon Exposed: No Muscle Exposed: No Joint Exposed: No Bone Exposed: No Treatment Notes Wound #2 (Calcaneus) Wound Laterality: Right Cleanser Soap and Water Discharge Instruction: Gently cleanse wound with antibacterial soap, rinse and pat dry prior to dressing wounds Vashe 5.8 (oz) Discharge Instruction: Use vashe 5.8 (oz) as directed Peri-Wound Care Topical Gentamicin Discharge Instruction: Apply as directed by Odelle Kosier. Primary Dressing Hydrofera Blue Ready Transfer Foam, 2.5x2.5 (in/in) Discharge Instruction: Apply Hydrofera Blue Ready to wound bed as directed Secondary Dressing ABD Pad 5x9 (in/in) Discharge Instruction: heel cup Secured With Kerlix Roll Sterile or Non-Sterile 6-ply 4.5x4 (yd/yd) Discharge Instruction: Apply Kerlix as directed Compression Wrap Compression Stockings Add-Ons Electronic Signature(s) Signed: 08/06/2023 5:08:46 PM By: Midge Aver MSN RN CNS WTA Entered By: Midge Aver on 08/06/2023 13:05:05 -------------------------------------------------------------------------------- Vitals  Details Patient Name: Date of Service: [name] 08/06/2023 12:15 PM Medical Record Number: [id number] Patient Account Number: [id number] Date of Birth/Sex: Treating RN: 2005-12-18 (17 y.o. [name] Primary Care Ruhee Enck: PA Zenovia Jordan, NO Other Clinician: Referring Julyssa Kyer: Treating Nishi Neiswonger/Extender: Elizebeth Brooking, KRISTA Weeks in Treatment: 7 Vital Signs Time Taken: 12:42 Temperature (F): 98.1 [place], [name] ([id number]) [phone].pdf Page 9 of 9 Height (in): 67 Pulse (bpm): 61 Weight (lbs): 130 Respiratory Rate (breaths/min): 16 Body Mass Index (BMI): 20.4 Blood Pressure (mmHg): 109/60 Reference Range: 80 - 120 mg / dl Electronic Signature(s) Signed: 08/06/2023 5:08:46 PM By: Midge Aver MSN RN CNS WTA Entered By: Midge Aver on 08/06/2023 12:43:14

## 2023-08-13 ENCOUNTER — Encounter: Payer: Medicaid Other | Admitting: Physician Assistant

## 2023-08-13 DIAGNOSIS — L89613 Pressure ulcer of right heel, stage 3: Secondary | ICD-10-CM | POA: Diagnosis not present

## 2023-08-14 NOTE — Progress Notes (Signed)
[name] ([id number]) [id number] Page 1 of 8 Visit Report for 08/13/2023 Chief Complaint Document Details Patient Name: Date of Service: [name] 08/13/2023 1:30 PM Medical Record Number: [id number] Patient Account Number: [id number] Date of Birth/Sex: Treating RN: 2005-11-25 (17 y.o. [name]) Yevonne Pax Primary Care Provider: PA Zenovia Jordan, NO Other Clinician: Referring Provider: Treating Provider/Extender: Elizebeth Brooking, KRISTA Weeks in Treatment: 8 Information Obtained from: Patient Chief Complaint Pressure ulcer left foot due to brace 06/17/2023; patient is here for review of a pressure area on the right plantar heel Electronic Signature(s) Signed: 08/13/2023 1:55:53 PM By: Allen Derry PA-C Entered By: Allen Derry on 08/13/2023 10:55:53 -------------------------------------------------------------------------------- Debridement Details Patient Name: Date of Service: [name] 08/13/2023 1:30 PM Medical Record Number: [id number] Patient Account Number: [id number] Date of Birth/Sex: Treating RN: 2006-06-14 (17 y.o. [name]) Yevonne Pax Primary Care Provider: PA Zenovia Jordan, NO Other Clinician: Referring Provider: Treating Provider/Extender: Elizebeth Brooking, KRISTA Weeks in Treatment: 8 Debridement Performed for Assessment: Wound #2 Right Calcaneus Performed By: Physician Allen Derry, PA-C The following information was scribed by: Yevonne Pax The information was scribed for: Allen Derry Debridement Type: Debridement Level of Consciousness (Pre-procedure): Awake and Alert Pre-procedure Verification/Time Out Yes - 13:55 Taken: Start Time: 13:55 Percent of Wound Bed Debrided: 100% T Area Debrided (cm): otal 0.03 Tissue and other material debrided: Viable, Non-Viable, Slough, Subcutaneous, Skin: Dermis , Skin: Epidermis, Slough Level: Skin/Subcutaneous Tissue Debridement Description: Excisional Instrument: Curette Bleeding:  Minimum Hemostasis Achieved: Pressure End Time: 14:03 [name] ([id number]) [id number] Page 2 of 8 Procedural Pain: 0 Post Procedural Pain: 0 Response to Treatment: Procedure was tolerated well Level of Consciousness (Post- Awake and Alert procedure): Post Debridement Measurements of Total Wound Length: (cm) 0.2 Stage: Category/Stage III Width: (cm) 0.2 Depth: (cm) 0.3 Volume: (cm) 0.009 Character of Wound/Ulcer Post Debridement: Improved Post Procedure Diagnosis Same as Pre-procedure Electronic Signature(s) Unsigned Entered ByYevonne Pax on 08/13/2023 11:01:57 -------------------------------------------------------------------------------- HPI Details Patient Name: Date of Service: [name] 08/13/2023 1:30 PM Medical Record Number: [id number] Patient Account Number: [id number] Date of Birth/Sex: Treating RN: Jan 03, 2006 (17 y.o. [name]) Yevonne Pax Primary Care Provider: PA Zenovia Jordan, NO Other Clinician: Referring Provider: Treating Provider/Extender: Elizebeth Brooking, KRISTA Weeks in Treatment: 8 History of Present Illness HPI Description: 01/20/2020 upon evaluation today patient presents for initial inspection here in our clinic concerning issues that he has been having with his left foot laterally at the fifth metatarsal location. It appears that he has issues unfortunately with chronic rubbing on his brace. He does have spina bifida and he has issues with this fairly frequently. With that being said the good news is this does not appear to be infected. The bad news is it is a recurrent issue. He is can be having surgery for foot reconstruction June 8. 01/27/2020 on evaluation today patient appears to be doing about the same in regard to his wound at this point. Fortunately there is no signs of active infection. Overall I feel like he is actually managing quite well. The wound is very macerated I think this is simply due to the fact  that he has been using a Band-Aid as opposed to an absorptive bandage over top of the collagen. He really has nothing to drain into and then been putting over top of this Covan which is occluding it even more. 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 He wears an AFO brace on both feet. Noteworthy that he was here in 2021 for an area on the left foot He tells me that he has had a wound on the plantar right heel for the better part of a year. Prior to this this may have opened and closed repetitively. He has not been specifically dressing this and he became alarmed when it would not close and also that there may have been an odor noticeable. He is here for a review of this. 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 ABI in our clinic was 1.22 on the right 10/30; x-ray of the right heel was normal no osseous destruction. His PCR culture that I did last week showed E. coli various anaerobes and MSSA. We have been using Hydrofera Blue and a heel offloading boot 07-01-2023 upon evaluation today patient appears to be doing decently well currently in regard to his wound. Fortunately there does not appear to be any signs of active infection at this time. This is a gentleman whom I have seen previously about 3 years ago. He actually is doing decently well at this point he did have some debridement last week with Dr. Leanord Hawking he said to have a cast today he will be come back on Friday to change this out. [name] ([id number]) [id number] Page 3 of 8 07-03-2023 upon evaluation today patient appears to be  doing well currently in regard to his wound. He has been tolerating the dressing changes without complication. Fortunately there does not appear to be any signs of infection which is good news I think the cast that a great job. He is here for the first cast change to ensure nothing was rubbing the medial part of his ankle was the only place in question he thinks a bigger boot could potentially benefit him there. 07-08-2023 patient's wound currently is showing signs of good improvement I am actually very pleased with where we stand is already measuring smaller and looking better I feel that the depth is filling in and I am very pleased in that regard. I do not see any signs of infection the cast seems to be doing well with this and make sure to pad his ankle area to make sure that nothing is rubbing in this region. 07-16-2023 upon evaluation today patient's heel ulcer has not made quite as much improvement as I like to see although a lot of the callus has loosened up around the edges of the wound has become obvious that I can clear away a lot more this callus and what I previously was able to with the being softer. For that reason I did go ahead and at this point perform debridement clearway necrotic debris and the patient tolerated this today without complication. Postdebridement the wound bed actually appears to be doing much better. 07-22-2023 upon evaluation today patient appears to be doing well currently in regard to his wound although I am a little concerned about how the cast is pushing on the side of his ankle. The biggest issue that I see is simply that it is when his ankle being so why it is put into the boot that it does not quite fit and it therefore  pushes a lot inward on that medial ankle. I think that if he can use his walker and not have to use the black part of the outer boot for his cast that we will take care of the issue and prevent this from causing problems he tells me has been  very active doing a lot of walking I think that is been a big part of the issue. 07-27-2023 upon evaluation today patient appears to be doing excellent in regard to his wounds. He has been tolerating the dressing changes without complication. Fortunately there does not appear to be any signs of active infection at this time which is great news and overall there is no need for sharp debridement today. Fortunately the patient seems to be making really good headway here towards closure and in general I think that we are doing extremely well currently. No debridement means we will not make this any larger and in turn I think we should see this be a lot smaller come next week even I am extremely pleased with where things stand. 08-06-2023 upon evaluation today patient appears to be doing well currently in regard to his wound which is showing signs of improvement this is still slowed going but nonetheless is a little bit smaller than it was last week it definitely looks much better than it was last week. 08-13-2023 upon evaluation today patient appears to be doing well currently in regard to his wound he does have a little bit of callus buildup and some dried drainage as well we can go and see what we can do to help out with that. Electronic Signature(s) Signed: 08/13/2023 2:10:40 PM By: Allen Derry PA-C Entered By: Allen Derry on 08/13/2023 11:10:39 -------------------------------------------------------------------------------- Physical Exam Details Patient Name: Date of Service: [name] 08/13/2023 1:30 PM Medical Record Number: [id number] Patient Account Number: [id number] Date of Birth/Sex: Treating RN: 2006-01-02 (17 y.o. [name] Primary Care Provider: PA Zenovia Jordan, NO Other Clinician: Referring Provider: Treating Provider/Extender: Elizebeth Brooking, KRISTA Weeks in Treatment: 8 Constitutional Well-nourished and well-hydrated in no acute distress. Respiratory normal  breathing without difficulty. Psychiatric this patient is able to make decisions and demonstrates good insight into disease process. Alert and Oriented x 3. pleasant and cooperative. Notes Upon inspection patient's wound bed once I cleaned this off clearway slough and biofilm down to good subcutaneous tissue along with callus he tolerated this without complication. Overall I feel like he is doing quite well and I do believe sticking with the [facility] is probably still the best thing to do. Electronic Signature(s) Signed: 08/13/2023 2:20:37 PM By: Allen Derry PA-C Entered By: Allen Derry on 08/13/2023 11:20:35 [name] ([id number]) [id number] Page 4 of 8 -------------------------------------------------------------------------------- Physician Orders Details Patient Name: Date of Service: [name] 08/13/2023 1:30 PM Medical Record Number: [id number] Patient Account Number: [id number] Date of Birth/Sex: Treating RN: 11/17/2005 (17 y.o. 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 Bathing/ Shower/ Hygiene Clean wound with Normal Saline or wound cleanser. Anesthetic (Use 'Patient Medications' Section for Anesthetic Order Entry) Lidocaine applied to wound bed Off-Loading Heel suspension boot  Wound Treatment Wound #2 - Calcaneus Wound Laterality: Right Cleanser: Soap and Water 1 x Per Week/30 Days Discharge Instructions: Gently cleanse wound with antibacterial soap, rinse and pat dry prior to  dressing wounds Cleanser: Vashe 5.8 (oz) 1 x Per Week/30 Days Discharge Instructions: Use vashe 5.8 (oz) as directed Topical: Gentamicin 1 x Per Week/30 Days Discharge Instructions: Apply as directed by provider. Prim Dressing: Hydrofera Blue Ready Transfer Foam, 2.5x2.5 (in/in) 1 x Per Week/30 Days ary Discharge Instructions: Apply Hydrofera Blue Ready to wound bed as directed Secondary Dressing: ABD Pad 5x9 (in/in) 1 x Per Week/30 Days Discharge Instructions: heel cup Secured With: Kerlix Roll Sterile or Non-Sterile 6-ply 4.5x4 (yd/yd) 1 x Per Week/30 Days Discharge Instructions: Apply Kerlix as directed Electronic Signature(s) Unsigned Entered ByYevonne Pax on 08/13/2023 11:01:32 Signature(s): [name] ([id number]) [id number] Date(s): [id number] Page 5 of 8 -------------------------------------------------------------------------------- Problem List Details Patient Name: Date of Service: [name] 08/13/2023 1:30 PM Medical Record Number: [id number] Patient Account Number: [id number] Date of Birth/Sex: Treating RN: 15-Mar-2006 (17 y.o. 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 [name] Primary Care Provider: PA Zenovia Jordan, NO Other Clinician: Referring Provider: Treating Provider/Extender: Elizebeth Brooking, KRISTA Weeks in Treatment: 8 Subjective Chief Complaint Information obtained from Patient Pressure ulcer left foot due to brace 06/17/2023; patient is here for review of a pressure area on the right plantar heel History of Present Illness (HPI) 01/20/2020 upon evaluation today patient presents for initial inspection here in our clinic concerning issues that he has been having with his left foot laterally at [facility] ([id number]) [id number] Page 6 of 8 the fifth metatarsal location. It appears that he has issues unfortunately with chronic rubbing on his brace. He does have spina bifida and he has issues with this fairly frequently. With that being said the good news is this does not appear to be infected. The bad news is it is a recurrent issue. He is can be having surgery for foot reconstruction June 8. 01/27/2020 on evaluation today patient appears to be doing about the same in regard to his wound at this point. Fortunately there is no signs of active infection. Overall I feel like he is actually managing quite well. The wound is very macerated I think this is simply due to the fact that he has been using a Band-Aid as opposed to an absorptive bandage over top of the collagen. He really has nothing to drain into and then been putting over top of this Covan which is occluding it even more. 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 He wears an AFO brace  on both feet. Noteworthy that he was here in 2021 for an area on the left foot He  tells me that he has had a wound on the plantar right heel for the better part of a year. Prior to this this may have opened and closed repetitively. He has not been specifically dressing this and he became alarmed when it would not close and also that there may have been an odor noticeable. He is here for a review of this. 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 ABI in our clinic was 1.22 on the right 10/30; x-ray of the right heel was normal no osseous destruction. His PCR culture that I did last week showed E. coli various anaerobes and MSSA. We have been using Hydrofera Blue and a heel offloading boot 07-01-2023 upon evaluation today patient appears to be doing decently well currently in regard to his wound. Fortunately there does not appear to be any signs of active infection at this time. This is a gentleman whom I have seen previously about 3 years ago. He actually is doing decently well at this point he did have some debridement last week with Dr. Leanord Hawking he said to have a cast today he will be come back on Friday to change this out. 07-03-2023 upon evaluation today patient appears to be doing well currently in regard to his wound. He has been tolerating the dressing changes without complication. Fortunately there does not appear to be any signs of infection which is good news I think the cast that a great job. He is here for the first cast change to ensure nothing was rubbing the medial part of his ankle was the only place in question he thinks a bigger boot could potentially benefit him there. 07-08-2023 patient's wound currently is showing signs of good improvement I am actually very pleased with where we stand is already measuring smaller and looking better I feel that the depth is filling in and I am very pleased in that regard. I do not see any signs of infection the cast seems to be  doing well with this and make sure to pad his ankle area to make sure that nothing is rubbing in this region. 07-16-2023 upon evaluation today patient's heel ulcer has not made quite as much improvement as I like to see although a lot of the callus has loosened up around the edges of the wound has become obvious that I can clear away a lot more this callus and what I previously was able to with the being softer. For that reason I did go ahead and at this point perform debridement clearway necrotic debris and the patient tolerated this today without complication. Postdebridement the wound bed actually appears to be doing much better. 07-22-2023 upon evaluation today patient appears to be doing well currently in regard to his wound although I am a little concerned about how the cast is pushing on the side of his ankle. The biggest issue that I see is simply that it is when his ankle being so why it is put into the boot that it does not quite fit and it therefore pushes a lot inward on that medial ankle. I think that if he can use his walker and not have to use the black part of the outer boot for his cast that we will take care of the issue and prevent this from causing problems he tells me  has been very active doing a lot of walking I think that is been a big part of the issue. 07-27-2023 upon evaluation today patient appears to be doing excellent in regard to his wounds. He has been tolerating the dressing changes without complication. Fortunately there does not appear to be any signs of active infection at this time which is great news and overall there is no need for sharp debridement today. Fortunately the patient seems to be making really good headway here towards closure and in general I think that we are doing extremely well currently. No debridement means we will not make this any larger and in turn I think we should see this be a lot smaller come next week even I am extremely pleased with where  things stand. 08-06-2023 upon evaluation today patient appears to be doing well currently in regard to his wound which is showing signs of improvement this is still slowed going but nonetheless is a little bit smaller than it was last week it definitely looks much better than it was last week. 08-13-2023 upon evaluation today patient appears to be doing well currently in regard to his wound he does have a little bit of callus buildup and some dried drainage as well we can go and see what we can do to help out with that. Objective Constitutional Well-nourished and well-hydrated in no acute distress. Vitals Time Taken: 1:53 PM, Height: 67 in, Weight: 130 lbs, BMI: 20.4, Temperature: 98.1 F, Pulse: 75 bpm, Respiratory Rate: 18 breaths/min, Blood Pressure: 126/74 mmHg. Respiratory normal breathing without difficulty. Psychiatric this patient is able to make decisions and demonstrates good insight into disease process. Alert and Oriented x 3. pleasant and cooperative. General Notes: Upon inspection patient's wound bed once I cleaned this off clearway slough and biofilm down to good subcutaneous tissue along with callus he tolerated this without complication. Overall I feel like he is doing quite well and I do believe sticking with the [facility] is probably still the best thing to do. Integumentary (Hair, Skin) Wound #2 status is Open. Original cause of wound was Pressure Injury. The date acquired was: 06/16/2022. The wound has been in treatment 8 weeks. [facility] ([id number]) [id number] Page 7 of 8 wound is located on the Right Calcaneus. The wound measures 0.2cm length x 0.2cm width x 0.3cm depth; 0.031cm^2 area and 0.009cm^3 volume. There is Fat Layer (Subcutaneous Tissue) exposed. There is no tunneling or undermining noted. There is a medium amount of sanguinous drainage noted. There is large (67-100%) red, pink granulation within the wound bed. There is  no necrotic tissue within the wound bed. Assessment Active Problems ICD-10 Pressure ulcer of right heel, stage 3 Lumbar spina bifida without hydrocephalus Procedures Wound #2 Pre-procedure diagnosis of Wound #2 is a Pressure Ulcer located on the Right Calcaneus . There was a Excisional Skin/Subcutaneous Tissue Debridement with a total area of 0.03 sq cm performed by Allen Derry, PA-C. With the following instrument(s): Curette to remove Viable and Non-Viable tissue/material. Material removed includes Subcutaneous Tissue, Slough, Skin: Dermis, and Skin: Epidermis. No specimens were taken. A time out was conducted at 13:55, prior to the start of the procedure. A Minimum amount of bleeding was controlled with Pressure. The procedure was tolerated well with a pain level of 0 throughout and a pain level of 0 following the procedure. Post Debridement Measurements: 0.2cm length x 0.2cm width x 0.3cm depth; 0.009cm^3 volume. Post debridement Stage noted as Category/Stage III. Character of Wound/Ulcer Post Debridement  is improved. Post procedure Diagnosis Wound #2: Same as Pre-Procedure Plan Follow-up Appointments: Return Appointment in 1 week. Bathing/ Shower/ Hygiene: Clean wound with Normal Saline or wound cleanser. Anesthetic (Use 'Patient Medications' Section for Anesthetic Order Entry): Lidocaine applied to wound bed Off-Loading: Heel suspension boot WOUND #2: - Calcaneus Wound Laterality: Right Cleanser: Soap and Water 1 x Per Week/30 Days Discharge Instructions: Gently cleanse wound with antibacterial soap, rinse and pat dry prior to dressing wounds Cleanser: Vashe 5.8 (oz) 1 x Per Week/30 Days Discharge Instructions: Use vashe 5.8 (oz) as directed Topical: Gentamicin 1 x Per Week/30 Days Discharge Instructions: Apply as directed by provider. Prim Dressing: Hydrofera Blue Ready Transfer Foam, 2.5x2.5 (in/in) 1 x Per Week/30 Days ary Discharge Instructions: Apply Hydrofera Blue Ready  to wound bed as directed Secondary Dressing: ABD Pad 5x9 (in/in) 1 x Per Week/30 Days Discharge Instructions: heel cup Secured With: Kerlix Roll Sterile or Non-Sterile 6-ply 4.5x4 (yd/yd) 1 x Per Week/30 Days Discharge Instructions: Apply Kerlix as directed 1. I would recommend based on what we are seeing that we have the patient going to continue to monitor for any signs of infection or worsening. We will continue with the [facility] he also continue with appropriate offloading. 2. Muscle do recommend that we continue with ABD pad to cover followed by roll gauze to secure in place. We will see patient back for reevaluation in 1 week here in the clinic. If anything worsens or changes patient will contact our office for additional recommendations. Electronic Signature(s) Signed: 08/13/2023 2:20:58 PM By: Allen Derry PA-C Entered By: Allen Derry on 08/13/2023 11:20:57 [name] ([id number]) [id number] Page 8 of 8 -------------------------------------------------------------------------------- SuperBill Details Patient Name: Date of Service: [name] 08/13/2023 Medical Record Number: [id number] Patient Account Number: [id number] Date of Birth/Sex: Treating RN: [birth year] (17 y.o. 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

## 2023-08-15 NOTE — Progress Notes (Signed)
[name] ([id number]) [id number] Page 1 of 8 Visit Report for 08/13/2023 Arrival Information Details Patient Name: Date of Service: [name] 08/13/2023 1:30 PM Medical Record Number: [id number] Patient Account Number: [id number] Date of Birth/Sex: Treating RN: 29-Nov-2005 (17 y.o. [name]) Yevonne Pax Primary Care Dailey Alberson: PA Zenovia Jordan, NO Other Clinician: Referring Reyden Smith: Treating Jakell Trusty/Extender: Elizebeth Brooking, KRISTA Weeks in Treatment: 8 Visit Information History Since Last Visit Added or deleted any medications: No Patient Arrived: Ambulatory Any Avery allergies or adverse reactions: No Arrival Time: 13:43 Had a fall or experienced change in No Accompanied By: self activities of daily living that may affect Transfer Assistance: None risk of falls: Patient Identification Verified: Yes Signs or symptoms of abuse/neglect since last visito No Secondary Verification Process Completed: Yes Hospitalized since last visit: No Patient Requires Transmission-Based Precautions: No Implantable device outside of the clinic excluding No Patient Has Alerts: No cellular tissue based products placed in the center since last visit: Has Dressing in Place as Prescribed: Yes Pain Present Now: No Electronic Signature(s) Signed: 08/14/2023 11:21:11 AM By: Yevonne Pax RN Entered By: Yevonne Pax on 08/13/2023 10:53:53 -------------------------------------------------------------------------------- Clinic Level of Care Assessment Details Patient Name: Date of Service: [name] 08/13/2023 1:30 PM Medical Record Number: [id number] Patient Account Number: [id number] Date of Birth/Sex: Treating RN: February 28, 2006 (17 y.o. [name]) Yevonne Pax Primary Care Tanica Gaige: PA Zenovia Jordan, NO Other Clinician: Referring Tatum Corl: Treating Winston Sobczyk/Extender: Elizebeth Brooking, KRISTA Weeks in Treatment: 8 Clinic Level of Care Assessment Items TOOL 1 Quantity  Score []  - 0 Use when EandM and Procedure is performed on INITIAL visit ASSESSMENTS - Nursing Assessment / Reassessment []  - 0 General Physical Exam (combine w/ comprehensive assessment (listed just below) when performed on Avery pt. evals) []  - 0 Comprehensive Assessment (HX, ROS, Risk Assessments, Wounds Hx, etc.) [name] ([id number]) [id number] Page 2 of 8 ASSESSMENTS - Wound and Skin Assessment / Reassessment []  - 0 Dermatologic / Skin Assessment (not related to wound area) ASSESSMENTS - Ostomy and/or Continence Assessment and Care []  - 0 Incontinence Assessment and Management []  - 0 Ostomy Care Assessment and Management (repouching, etc.) PROCESS - Coordination of Care []  - 0 Simple Patient / Family Education for ongoing care []  - 0 Complex (extensive) Patient / Family Education for ongoing care []  - 0 Staff obtains Chiropractor, Records, T Results / Process Orders est []  - 0 Staff telephones HHA, Nursing Homes / Clarify orders / etc []  - 0 Routine Transfer to another Facility (non-emergent condition) []  - 0 Routine Hospital Admission (non-emergent condition) []  - 0 Avery Admissions / Manufacturing engineer / Ordering NPWT Apligraf, etc. , []  - 0 Emergency Hospital Admission (emergent condition) PROCESS - Special Needs []  - 0 Pediatric / Minor Patient Management []  - 0 Isolation Patient Management []  - 0 Hearing / Language / Visual special needs []  - 0 Assessment of Community assistance (transportation, D/C planning, etc.) []  - 0 Additional assistance / Altered mentation []  - 0 Support Surface(s) Assessment (bed, cushion, seat, etc.) INTERVENTIONS - Miscellaneous []  - 0 External ear exam []  - 0 Patient Transfer (multiple staff / Nurse, adult / Similar devices) []  - 0 Simple Staple / Suture removal (25 or less) []  - 0 Complex Staple / Suture removal (26 or more) []  - 0 Hypo/Hyperglycemic Management (do not check if billed  separately) []  - 0 Ankle / Brachial Index (ABI) - do not check if billed separately Has the patient been seen at the hospital  within the last three years: Yes Total Score: 0 Level Of Care: ____ Electronic Signature(s) Signed: 08/14/2023 11:21:11 AM By: Yevonne Pax RN Entered By: Yevonne Pax on 08/13/2023 11:02:05 -------------------------------------------------------------------------------- Encounter Discharge Information Details Patient Name: Date of Service: [name] 08/13/2023 1:30 PM Medical Record Number: [id number] Patient Account Number: [id number] Date of Birth/Sex: Treating RN: 03/16/2006 (17 y.o. [name] Primary Care Megha Agnes: PA Zenovia Jordan, NO Other Clinician: Referring Makenzye Troutman: Treating Clydean Posas/Extender: Elizebeth Brooking, KRISTA Weeks in Treatment: 8 [place], Cleophas Dunker ([id number]) [id number] Page 3 of 8 Encounter Discharge Information Items Post Procedure Vitals Discharge Condition: Stable Temperature (F): 98.1 Ambulatory Status: Ambulatory Pulse (bpm): 75 Discharge Destination: Home Respiratory Rate (breaths/min): 18 Transportation: Private Auto Blood Pressure (mmHg): 126/64 Accompanied By: self Schedule Follow-up Appointment: Yes Clinical Summary of Care: Electronic Signature(s) Signed: 08/14/2023 11:21:11 AM By: Yevonne Pax RN Entered By: Yevonne Pax on 08/13/2023 11:03:34 -------------------------------------------------------------------------------- Lower Extremity Assessment Details Patient Name: Date of Service: [name] 08/13/2023 1:30 PM Medical Record Number: [id number] Patient Account Number: [id number] Date of Birth/Sex: Treating RN: 2006-05-02 (17 y.o. [name]) Yevonne Pax Primary Care Jomar Denz: PA Zenovia Jordan, NO Other Clinician: Referring Brittane Grudzinski: Treating Constance Whittle/Extender: Elizebeth Brooking, KRISTA Weeks in Treatment: 8 Electronic Signature(s) Signed: 08/14/2023 11:21:11 AM By: Yevonne Pax RN Entered By: Yevonne Pax on 08/13/2023 10:59:56 -------------------------------------------------------------------------------- Multi Wound Chart Details Patient Name: Date of Service: Morton Amy IO Avery 08/13/2023 1:30 PM Medical Record Number: [id number] Patient Account Number: [id number] Date of Birth/Sex: Treating RN: [birth year] (17 y.o. [name]) Yevonne Pax Primary Care Rosealynn Mateus: PA Zenovia Jordan, NO Other Clinician: Referring Gawain Crombie: Treating Donya Tomaro/Extender: Elizebeth Brooking, KRISTA Weeks in Treatment: 8 Vital Signs Height(in): 67 Pulse(bpm): 75 Weight(lbs): 130 Blood Pressure(mmHg): 126/74 Body Mass Index(BMI): 20.4 Temperature(F): 98.1 Respiratory Rate(breaths/min): 18 [2:Photos:] Kevin, Avery ([id number]) [2:Photos: Right Calcaneus Wound Location: Pressure Injury Wounding Event: Pressure Ulcer Primary Etiology: 06/16/2022 Date Acquired: 8 Weeks of Treatment: Open Wound Status: No Wound Recurrence: 0.2x0.2x0.3 Measurements L x W x D (cm) 0.031 A (cm) : rea  0.009 Volume (cm) : 96.20% % Reduction in A rea: 98.20% % Reduction in Volume: Category/Stage III Classification: Medium Exudate A mount: Sanguinous Exudate Type: red Exudate Color: Large (67-100%) Granulation A mount: Red, Pink Granulation Quality:  None Present (0%) Necrotic A mount: Fat Layer (Subcutaneous Tissue): Yes N/A Exposed Structures: Fascia: No Tendon: No Muscle: No Joint: No Bone: No Medium (34-66%) Epithelialization:] [N/A:No Photos N/A N/A N/A N/A N/A N/A N/A N/A N/A N/A N/A N/A N/A  N/A N/A N/A N/A N/A N/A N/A N/A] Treatment Notes Electronic Signature(s) Signed: 08/14/2023 11:21:11 AM By: Yevonne Pax RN Entered By: Yevonne Pax on 08/13/2023 11:00:02 -------------------------------------------------------------------------------- Multi-Disciplinary Care Plan Details Patient Name: Date of Service: [name] 08/13/2023 1:30 PM Medical Record Number: [id number] Patient Account Number:  [id number] Date of Birth/Sex: Treating RN: [birth year] (17 y.o. [name]) Yevonne Pax Primary Care Giovanni Biby: PA Zenovia Jordan, NO Other Clinician: Referring Jacie Tristan: Treating Cassidy Tashiro/Extender: Elizebeth Brooking, KRISTA Weeks in Treatment: 8 Active Inactive Pressure Nursing Diagnoses: Knowledge deficit related to causes and risk factors for pressure ulcer development Knowledge deficit related to management of pressures ulcers Potential for impaired tissue integrity related to pressure, friction, moisture, and shear Goals: Patient will remain free from development of additional pressure ulcers Date Initiated: 06/18/2023 Date Inactivated: 07/22/2023 Target Resolution Date: 07/18/2023 Goal Status: Met Patient will remain free of pressure ulcers Date Initiated: 06/18/2023 Date Inactivated: 07/22/2023 Target Resolution Date: 07/18/2023  Goal Status: Met Patient/caregiver will verbalize risk factors for pressure ulcer development Date Initiated: 06/18/2023 Date Inactivated: 07/22/2023 Target Resolution Date: 07/11/2023 Kevin, Avery ([id number]) [id number] Page 5 of 8 Goal Status: Met Patient/caregiver will verbalize understanding of pressure ulcer management Date Initiated: 06/18/2023 Target Resolution Date: 08/17/2023 Goal Status: Active Interventions: Assess offloading mechanisms upon admission and as needed Assess potential for pressure ulcer upon admission and as needed Provide education on pressure ulcers Notes: Wound/Skin Impairment Nursing Diagnoses: Impaired tissue integrity Knowledge deficit related to ulceration/compromised skin integrity Goals: Patient/caregiver will verbalize understanding of skin care regimen Date Initiated: 06/18/2023 Date Inactivated: 07/22/2023 Target Resolution Date: 07/18/2023 Goal Status: Met Ulcer/skin breakdown will have a volume reduction of 30% by week 4 Date Initiated: 06/18/2023 Date Inactivated: 07/22/2023 Target  Resolution Date: 07/18/2023 Goal Status: Met Ulcer/skin breakdown will have a volume reduction of 50% by week 8 Date Initiated: 06/18/2023 Target Resolution Date: 08/17/2023 Goal Status: Active Ulcer/skin breakdown will have a volume reduction of 80% by week 12 Date Initiated: 06/18/2023 Target Resolution Date: 09/17/2023 Goal Status: Active Ulcer/skin breakdown will heal within 14 weeks Date Initiated: 06/18/2023 Target Resolution Date: 10/01/2023 Goal Status: Active Interventions: Assess patient/caregiver ability to obtain necessary supplies Assess patient/caregiver ability to perform ulcer/skin care regimen upon admission and as needed Assess ulceration(s) every visit Provide education on ulcer and skin care Treatment Activities: Skin care regimen initiated : 06/17/2023 Notes: Electronic Signature(s) Signed: 08/14/2023 11:21:11 AM By: Yevonne Pax RN Entered By: Yevonne Pax on 08/13/2023 11:02:13 -------------------------------------------------------------------------------- Pain Assessment Details Patient Name: Date of Service: [name] 08/13/2023 1:30 PM Medical Record Number: [id number] Patient Account Number: [id number] Date of Birth/Sex: Treating RN: 28-Apr-2006 (17 y.o. [name] Primary Care Alvira Hecht: PA Zenovia Jordan, NO Other Clinician: Referring Mazzy Santarelli: Treating Marquerite Forsman/Extender: Elizebeth Brooking, KRISTA Weeks in Treatment: 8 Active Problems Location of Pain Severity and Description of Pain [name] ([id number]) [id number] Page 6 of 8 Patient Has Paino No Site Locations Pain Management and Medication Current Pain Management: Electronic Signature(s) Signed: 08/14/2023 11:21:11 AM By: Yevonne Pax RN Entered By: Yevonne Pax on 08/13/2023 10:54:30 -------------------------------------------------------------------------------- Patient/Caregiver Education Details Patient Name: Date of Service: [name]  12/19/2024andnbsp1:30 PM Medical Record Number: [id number] Patient Account Number: [id number] Date of Birth/Gender: Treating RN: 2006-07-02 (17 y.o. [name] Primary Care Physician: PA Zenovia Jordan, NO Other Clinician: Referring Physician: Treating Physician/Extender: Barnabas Harries Weeks in Treatment: 8 Education Assessment Education Provided To: Patient Education Topics Provided Pressure: Handouts: Pressure Injury: Prevention and Offloading Methods: Explain/Verbal Responses: State content correctly Electronic Signature(s) Signed: 08/14/2023 11:21:11 AM By: Yevonne Pax RN Entered By: Yevonne Pax on 08/13/2023 11:02:24 [name] ([id number]) [id number] Page 7 of 8 -------------------------------------------------------------------------------- Wound Assessment Details Patient Name: Date of Service: [name] 08/13/2023 1:30 PM Medical Record Number: [id number] Patient Account Number: [id number] Date of Birth/Sex: Treating RN: 07-06-2006 (17 y.o. [name]) Yevonne Pax Primary Care Manjot Hinks: PA Zenovia Jordan, NO Other Clinician: Referring Ahlam Piscitelli: Treating Dacoda Spallone/Extender: Elizebeth Brooking, KRISTA Weeks in Treatment: 8 Wound Status Wound Number: 2 Primary Etiology: Pressure Ulcer Wound Location: Right Calcaneus Wound Status: Open Wounding Event: Pressure Injury Date Acquired: 06/16/2022 Weeks Of Treatment: 8 Clustered Wound: No Wound Measurements Length: (cm) 0.2 Width: (cm) 0.2 Depth: (cm) 0.3 Area: (cm) 0.031 Volume: (cm) 0.009 % Reduction in Area: 96.2% % Reduction in Volume: 98.2% Epithelialization: Medium (34-66%) Tunneling: No Undermining: No Wound Description Classification: Category/Stage III Exudate Amount: Medium Exudate Type: Sanguinous Exudate Color:  red Foul Odor After Cleansing: No Slough/Fibrino No Wound Bed Granulation Amount: Large (67-100%) Exposed Structure Granulation Quality: Red,  Pink Fascia Exposed: No Necrotic Amount: None Present (0%) Fat Layer (Subcutaneous Tissue) Exposed: Yes Tendon Exposed: No Muscle Exposed: No Joint Exposed: No Bone Exposed: No Treatment Notes Wound #2 (Calcaneus) Wound Laterality: Right Cleanser Soap and Water Discharge Instruction: Gently cleanse wound with antibacterial soap, rinse and pat dry prior to dressing wounds Vashe 5.8 (oz) Discharge Instruction: Use vashe 5.8 (oz) as directed Peri-Wound Care Topical Gentamicin Discharge Instruction: Apply as directed by Cortland Crehan. Primary Dressing Hydrofera Blue Ready Transfer Foam, 2.5x2.5 (in/in) Discharge Instruction: Apply Hydrofera Blue Ready to wound bed as directed Secondary Dressing ABD Pad 5x9 (in/in) Discharge Instruction: heel cup [name] ([id number]) [id number] Page 8 of 8 Secured With [employer] or Non-Sterile 6-ply 4.5x4 (yd/yd) Discharge Instruction: Apply Kerlix as directed Compression Wrap Compression Stockings Add-Ons Electronic Signature(s) Signed: 08/14/2023 11:21:11 AM By: Yevonne Pax RN Entered By: Yevonne Pax on 08/13/2023 10:59:43 -------------------------------------------------------------------------------- Vitals Details Patient Name: Date of Service: [name] 08/13/2023 1:30 PM Medical Record Number: [id number] Patient Account Number: [id number] Date of Birth/Sex: Treating RN: July 31, 2006 (17 y.o. [name]) Yevonne Pax Primary Care Jawon Dipiero: PA Zenovia Jordan, NO Other Clinician: Referring Alazae Crymes: Treating Terisha Losasso/Extender: Elizebeth Brooking, KRISTA Weeks in Treatment: 8 Vital Signs Time Taken: 13:53 Temperature (F): 98.1 Height (in): 67 Pulse (bpm): 75 Weight (lbs): 130 Respiratory Rate (breaths/min): 18 Body Mass Index (BMI): 20.4 Blood Pressure (mmHg): 126/74 Reference Range: 80 - 120 mg / dl Electronic Signature(s) Signed: 08/14/2023 11:21:11 AM By: Yevonne Pax RN Entered By: Yevonne Pax on 08/13/2023 10:54:23

## 2023-08-31 ENCOUNTER — Encounter: Payer: Medicaid Other | Attending: Physician Assistant | Admitting: Physician Assistant

## 2023-08-31 DIAGNOSIS — L89613 Pressure ulcer of right heel, stage 3: Secondary | ICD-10-CM | POA: Insufficient documentation

## 2023-08-31 DIAGNOSIS — Q052 Lumbar spina bifida with hydrocephalus: Secondary | ICD-10-CM | POA: Insufficient documentation

## 2023-08-31 NOTE — Progress Notes (Signed)
 [name] ([id number]) [id number] Page 1 of 8 Visit Report for 08/31/2023 Chief Complaint Document Details Patient Name: Date of Service: [name] 08/31/2023 12:15 PM Medical Record Number: [id number] Patient Account Number: [id number] Date of Birth/Sex: Treating RN: May 12, 2006 (18 y.o. [name] Primary Care Provider: PA SANDRA, NO Other Clinician: Referring Provider: Treating Provider/Extender: [name] Weeks in Treatment: 10 Information Obtained from: Patient Chief Complaint Pressure ulcer left foot due to brace 06/17/2023; patient is here for review of a pressure area on the right plantar heel Electronic Signature(s) Signed: 08/31/2023 12:44:15 PM By: [name] Entered By: Kevin Andre on 08/31/2023 12:44:15 -------------------------------------------------------------------------------- Debridement Details Patient Name: Date of Service: [name] 08/31/2023 12:15 PM Medical Record Number: [id number] Patient Account Number: [id number] Date of Birth/Sex: Treating RN: June 10, 2006 (17 y.o. [name] Primary Care Provider: PA SANDRA, NO Other Clinician: Referring Provider: Treating Provider/Extender: [name] Weeks in Treatment: 10 Debridement Performed for Assessment: Wound #2 Right Calcaneus Performed By: Physician Kevin Andre, Avery The following information was scribed by: [name] The information was scribed for: Kevin Andre Debridement Type: Debridement Level of Consciousness (Pre-procedure): Awake and Alert Pre-procedure Verification/Time Out No Taken: Start Time: 12:33 Percent of Wound Bed Debrided: 100% T Area Debrided (cm): otal 0.2 Tissue and other material debrided: Viable, Non-Viable, Callus, Slough, Subcutaneous, Slough Level: Skin/Subcutaneous Tissue Debridement Description: Excisional Instrument: Curette Bleeding: Minimum Hemostasis Achieved:  Pressure Procedural Pain: 0 [name] ([id number]) [id number] Page 2 of 8 Post Procedural Pain: 0 Response to Treatment: Procedure was tolerated well Level of Consciousness (Post- Awake and Alert procedure): Post Debridement Measurements of Total Wound Length: (cm) 0.5 Stage: Category/Stage III Width: (cm) 0.5 Depth: (cm) 0.3 Volume: (cm) 0.059 Character of Wound/Ulcer Post Debridement: Stable Post Procedure Diagnosis Same as Pre-procedure Electronic Signature(s) Signed: 08/31/2023 2:05:44 PM By: Kevin Blossom MSN RN CNS WTA Signed: 08/31/2023 2:24:34 PM By: [name] Entered By: [name] on 08/31/2023 12:39:58 -------------------------------------------------------------------------------- HPI Details Patient Name: Date of Service: [name] 08/31/2023 12:15 PM Medical Record Number: [id number] Patient Account Number: [id number] Date of Birth/Sex: Treating RN: [birth year] (17 y.o. [name] Primary Care Provider: PA SANDRA, NO Other Clinician: Referring Provider: Treating Provider/Extender: [name] Weeks in Treatment: 10 History of Present Illness HPI Description: 01/20/2020 upon evaluation today patient presents for initial inspection here in our clinic concerning issues that he has been having with his left foot laterally at the fifth metatarsal location. It appears that he has issues unfortunately with chronic rubbing on his brace. He does have spina bifida and he has issues with this fairly frequently. With that being said the good news is this does not appear to be infected. The bad news is it is a recurrent issue. He is can be having surgery for foot reconstruction June 8. 01/27/2020 on evaluation today patient appears to be doing about the same in regard to his wound at this point. Fortunately there is no signs of active infection. Overall I feel like he is actually managing quite well. The wound is very  macerated I think this is simply due to the fact that he has been using a Band-Aid as opposed to an absorptive bandage over top of the collagen. He really has nothing to drain into and then been putting over top of this Covan which is occluding it even more. 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 He wears an AFO brace on both feet. Noteworthy that he was here in 2021 for an area on the left foot He tells me that he has had a wound on the plantar right heel for the better part of a year. Prior to this this may have opened and closed repetitively. He has not been specifically dressing this and he became alarmed when it would not close and also that there may have been an odor noticeable. He is here for a review of this. 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 ABI in our clinic was 1.22 on the right 10/30; x-ray of the right heel was normal no osseous destruction. His PCR culture that I did last week showed E. coli various anaerobes and MSSA. We have been using Hydrofera Blue and a heel offloading boot 07-01-2023 upon evaluation today patient appears to be doing decently well currently in regard to his wound. Fortunately there does not appear to be any signs of active infection at this time. This is a gentleman whom I have seen previously about 3 years ago. He actually is doing decently well at this point he did have some debridement last week with Dr. Rufus he said to have a cast today he will be come back on Friday to change this out. 07-03-2023 upon evaluation today patient appears to be doing well currently in regard to  his wound. He has been tolerating the dressing changes without complication. Fortunately there does not appear to be any signs of infection which is good news I think the cast that a great job. He is here for the first cast PIER, BOSHER ([id number]) [id number] Page 3 of 8 change to ensure nothing was rubbing the medial part of his ankle was the only place in question he thinks a bigger boot could potentially benefit him there. 07-08-2023 patient's wound currently is showing signs of good improvement I am actually very pleased with where we stand is already measuring smaller and looking better I feel that the depth is filling in and I am very pleased in that regard. I do not see any signs of infection the cast seems to be doing well with this and make sure to pad his ankle area to make sure that nothing is rubbing in this region. 07-16-2023 upon evaluation today patient's heel ulcer has not made quite as much improvement as I like to see although a lot of the callus has loosened up around the edges of the wound has become obvious that I can clear away a lot more this callus and what I previously was able to with the being softer. For that reason I did go ahead and at this point perform debridement clearway necrotic debris and the patient tolerated this today without complication. Postdebridement the wound bed actually appears to be doing much better. 07-22-2023 upon evaluation today patient appears to be doing well currently in regard to his wound although I am a little concerned about how the cast is pushing on the side of his ankle. The biggest issue that I see is simply that it is when his ankle being so why it is put into the boot  that it does not quite fit and it therefore pushes a lot inward on that medial ankle. I think that if he can use his walker and not have to use the black part of the outer boot for his cast that we will take care of the issue and prevent this  from causing problems he tells me has been very active doing a lot of walking I think that is been a big part of the issue. 07-27-2023 upon evaluation today patient appears to be doing excellent in regard to his wounds. He has been tolerating the dressing changes without complication. Fortunately there does not appear to be any signs of active infection at this time which is great news and overall there is no need for sharp debridement today. Fortunately the patient seems to be making really good headway here towards closure and in general I think that we are doing extremely well currently. No debridement means we will not make this any larger and in turn I think we should see this be a lot smaller come next week even I am extremely pleased with where things stand. 08-06-2023 upon evaluation today patient appears to be doing well currently in regard to his wound which is showing signs of improvement this is still slowed going but nonetheless is a little bit smaller than it was last week it definitely looks much better than it was last week. 08-13-2023 upon evaluation today patient appears to be doing well currently in regard to his wound he does have a little bit of callus buildup and some dried drainage as well we can go and see what we can do to help out with that. 08-31-23 upon evaluation patient's foot wound actually appears to be very callused. I do think that we can have to perform some debridement here to clear this away and he is in agreement with the plan. Fortunately I do not see any signs of active infection at this time. Electronic Signature(s) Signed: 08/31/2023 2:10:08 PM By: Kevin Ferraris Avery Entered By: Kevin Ferraris on 08/31/2023 14:10:08 -------------------------------------------------------------------------------- Physical Exam Details Patient Name: Date of Service: [name] 08/31/2023 12:15 PM Medical Record Number: [id number] Patient Account Number: [id number] Date of  Birth/Sex: Treating RN: 2005/09/27 (18 y.o. [name] Primary Care Provider: PA SANDRA, NO Other Clinician: Referring Provider: Treating Provider/Extender: Kevin Ferraris SMOCK, Avery Weeks in Treatment: 10 Constitutional Well-nourished and well-hydrated in no acute distress. Respiratory normal breathing without difficulty. Psychiatric this patient is able to make decisions and demonstrates good insight into disease process. Alert and Oriented x 3. pleasant and cooperative. Notes Upon inspection patient's wound bed actually showed some signs of active infection at this time which is great news and in general I do believe that we are making excellent headway here towards closure which is excellent as well. Electronic Signature(s) Signed: 08/31/2023 2:10:31 PM By: Kevin Ferraris Avery Entered By: Kevin Ferraris on 08/31/2023 14:10:31 [name] ([id number]) [id number] Page 4 of 8 -------------------------------------------------------------------------------- Physician Orders Details Patient Name: Date of Service: [name] 08/31/2023 12:15 PM Medical Record Number: [id number] Patient Account Number: [id number] Date of Birth/Sex: Treating RN: [birth year] (18 y.o. [name] Primary Care Provider: PA SANDRA, NO Other Clinician: Referring Provider: Treating Provider/Extender: Kevin Ferraris SMOCK BRUNO Weeks in Treatment: 10 The following information was scribed by: [name] The information was scribed for: Kevin Ferraris Verbal / Phone Orders: No Diagnosis Coding Follow-up Appointments Return Appointment in 1 week. Bathing/ [employer]  Clean wound with Normal Saline or wound cleanser. Anesthetic (Use 'Patient Medications' Section for Anesthetic Order Entry) Lidocaine applied to wound bed Off-Loading Heel suspension boot Wound Treatment Wound #2 - Calcaneus Wound Laterality: Right Cleanser: Soap and Water 1 x Per Week/30  Days Discharge Instructions: Gently cleanse wound with antibacterial soap, rinse and pat dry prior to dressing wounds Cleanser: Vashe 5.8 (oz) 1 x Per Week/30 Days Discharge Instructions: Use vashe 5.8 (oz) as directed Topical: Gentamicin 1 x Per Week/30 Days Discharge Instructions: Apply as directed by provider. Prim Dressing: Hydrofera Blue Ready Transfer Foam, 2.5x2.5 (in/in) 1 x Per Week/30 Days ary Discharge Instructions: Apply Hydrofera Blue Ready to wound bed as directed Secondary Dressing: ABD Pad 5x9 (in/in) 1 x Per Week/30 Days Discharge Instructions: heel cup Secured With: Kerlix Roll Sterile or Non-Sterile 6-ply 4.5x4 (yd/yd) 1 x Per Week/30 Days Discharge Instructions: Apply Kerlix as directed Electronic Signature(s) Signed: 08/31/2023 2:05:44 PM By: Kevin Blossom MSN RN CNS WTA Signed: 08/31/2023 2:24:34 PM By: Kevin Ferraris Avery Entered By: [name] on 08/31/2023 12:39:21 [name] ([id number]) [id number] Page 5 of 8 -------------------------------------------------------------------------------- Problem List Details Patient Name: Date of Service: [name] 08/31/2023 12:15 PM Medical Record Number: [id number] Patient Account Number: [id number] Date of Birth/Sex: Treating RN: [birth year] (18 y.o. 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 [name] Primary Care Provider: PA SANDRA, NO Other Clinician: Referring Provider: Treating Provider/Extender: Kevin Ferraris SMOCK BRUNO Weeks in Treatment: 10 Subjective Chief Complaint Information obtained from Patient Pressure ulcer left foot due to brace 06/17/2023; patient is here for review of a pressure area on the right plantar heel History of Present Illness (HPI) 01/20/2020 upon evaluation today patient presents for initial inspection here in our clinic concerning issues that he has been having with his left foot laterally at SAURABH, HETTICH ([id number]) [id number] Page 6 of 8 the fifth metatarsal location. It appears that he has issues unfortunately with chronic rubbing on his brace. He does have spina bifida and he has issues with this fairly frequently. With that being said the good news is this does not appear to be infected. The bad news is it is a recurrent issue. He is can be having surgery for foot reconstruction June 8. 01/27/2020 on evaluation today patient appears to be doing about the same in regard to his wound at this point. Fortunately there is no signs of active infection. Overall I feel like he is actually managing quite well. The wound is very macerated I think this is simply due to the fact that he has been using a Band-Aid as opposed to an absorptive bandage over top of the collagen. He really has nothing to drain into and then been putting over top of this Covan which is occluding it even more. READMISSION 06/17/2023 This is a 18 year old man who has L4 level spina bifida and had a Chiari malformation at  birth. He had resultant hydrocephalus as well. 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 He wears an AFO brace on both feet. Noteworthy that he was here in 2021 for an area on the left foot He tells me that he has had a wound on the plantar right heel for the better part of a year. Prior to this this may have opened and closed repetitively. He has not been specifically dressing this and he became alarmed when it would not close and also that there may have been an odor noticeable. He is here for a review of this. 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 ABI in our clinic was 1.22 on the right 10/30; x-ray of the right heel was normal no osseous destruction. His PCR culture that I did last week showed E. coli various anaerobes and MSSA. We have been using Hydrofera Blue and a heel offloading boot 07-01-2023 upon evaluation today patient appears to be doing decently well currently in regard to his wound. Fortunately there does not appear to be any signs of active infection at this time. This is a gentleman whom I have seen previously about 3 years ago. He actually is doing decently well at this point he did have some debridement last week with Dr. Rufus he said to have a cast today he will be come back on Friday to change this out. 07-03-2023 upon evaluation today patient appears to be doing well currently in regard to his wound. He has been tolerating the dressing changes without complication. Fortunately there does not appear to be any signs of infection which is good news I think the cast that a great job. He is here for the first cast change to ensure nothing was rubbing the medial part of his ankle was the only place in question he thinks a bigger boot could potentially benefit him there. 07-08-2023 patient's wound currently is showing signs of good improvement I am actually very pleased with where we stand is  already measuring smaller and looking better I feel that the depth is filling in and I am very pleased in that regard. I do not see any signs of infection the cast seems to be doing well with this and make sure to pad his ankle area to make sure that nothing is rubbing in this region. 07-16-2023 upon evaluation today patient's heel ulcer has not made quite as much improvement as I like to see although a lot of the callus has loosened up around the edges of the wound has become obvious that I can clear away a lot more this callus and what I previously was able to with the being softer. For that reason I did go ahead and at this point perform debridement clearway necrotic debris and the patient tolerated this today without complication. Postdebridement the wound bed actually appears to be doing much better. 07-22-2023 upon evaluation today patient appears to be doing well currently in regard to his wound although I am a little concerned about how the cast is pushing on the side of his ankle. The biggest issue that I see is simply that it is when his ankle being so why it is put into the boot that it does not quite fit and it therefore pushes a lot inward on that medial ankle.  I think that if he can use his walker and not have to use the black part of the outer boot for his cast that we will take care of the issue and prevent this from causing problems he tells me has been very active doing a lot of walking I think that is been a big part of the issue. 07-27-2023 upon evaluation today patient appears to be doing excellent in regard to his wounds. He has been tolerating the dressing changes without complication. Fortunately there does not appear to be any signs of active infection at this time which is great news and overall there is no need for sharp debridement today. Fortunately the patient seems to be making really good headway here towards closure and in general I think that we are doing  extremely well currently. No debridement means we will not make this any larger and in turn I think we should see this be a lot smaller come next week even I am extremely pleased with where things stand. 08-06-2023 upon evaluation today patient appears to be doing well currently in regard to his wound which is showing signs of improvement this is still slowed going but nonetheless is a little bit smaller than it was last week it definitely looks much better than it was last week. 08-13-2023 upon evaluation today patient appears to be doing well currently in regard to his wound he does have a little bit of callus buildup and some dried drainage as well we can go and see what we can do to help out with that. 08-31-23 upon evaluation patient's foot wound actually appears to be very callused. I do think that we can have to perform some debridement here to clear this away and he is in agreement with the plan. Fortunately I do not see any signs of active infection at this time. Objective Constitutional Well-nourished and well-hydrated in no acute distress. Vitals Time Taken: 12:23 PM, Height: 67 in, Weight: 130 lbs, BMI: 20.4, Temperature: 98.0 F, Pulse: 83 bpm, Respiratory Rate: 18 breaths/min, Blood Pressure: 127/77 mmHg. Respiratory normal breathing without difficulty. Psychiatric this patient is able to make decisions and demonstrates good insight into disease process. Alert and Oriented x 3. pleasant and cooperative. General Notes: Upon inspection patient's wound bed actually showed some signs of active infection at this time which is great news and in general I do believe that we are making excellent headway here towards closure which is excellent as well. [name] ([id number]) [id number] Page 7 of 8 Integumentary (Hair, Skin) Wound #2 status is Open. Original cause of wound was Pressure Injury. The date acquired was: 06/16/2022. The wound has been in treatment  10 weeks. The wound is located on the Right Calcaneus. The wound measures 0.5cm length x 0.5cm width x 0.3cm depth; 0.196cm^2 area and 0.059cm^3 volume. There is Fat Layer (Subcutaneous Tissue) exposed. There is a medium amount of sanguinous drainage noted. There is large (67-100%) red, pink granulation within the wound bed. There is no necrotic tissue within the wound bed. Assessment Active Problems ICD-10 Pressure ulcer of right heel, stage 3 Lumbar spina bifida without hydrocephalus Procedures Wound #2 Pre-procedure diagnosis of Wound #2 is a Pressure Ulcer located on the Right Calcaneus . There was a Excisional Skin/Subcutaneous Tissue Debridement with a total area of 0.2 sq cm performed by Kevin Ferraris, Avery. With the following instrument(s): Curette to remove Viable and Non-Viable tissue/material. Material removed includes Callus, Subcutaneous Tissue, and Slough. No specimens were taken.A Minimum amount of  bleeding was controlled with Pressure. The procedure was tolerated well with a pain level of 0 throughout and a pain level of 0 following the procedure. Post Debridement Measurements: 0.5cm length x 0.5cm width x 0.3cm depth; 0.059cm^3 volume. Post debridement Stage noted as Category/Stage III. Character of Wound/Ulcer Post Debridement is stable. Post procedure Diagnosis Wound #2: Same as Pre-Procedure Plan Follow-up Appointments: Return Appointment in 1 week. Bathing/ Shower/ Hygiene: Clean wound with Normal Saline or wound cleanser. Anesthetic (Use 'Patient Medications' Section for Anesthetic Order Entry): Lidocaine applied to wound bed Off-Loading: Heel suspension boot WOUND #2: - Calcaneus Wound Laterality: Right Cleanser: Soap and Water 1 x Per Week/30 Days Discharge Instructions: Gently cleanse wound with antibacterial soap, rinse and pat dry prior to dressing wounds Cleanser: Vashe 5.8 (oz) 1 x Per Week/30 Days Discharge Instructions: Use vashe 5.8 (oz) as  directed Topical: Gentamicin 1 x Per Week/30 Days Discharge Instructions: Apply as directed by provider. Prim Dressing: Hydrofera Blue Ready Transfer Foam, 2.5x2.5 (in/in) 1 x Per Week/30 Days ary Discharge Instructions: Apply Hydrofera Blue Ready to wound bed as directed Secondary Dressing: ABD Pad 5x9 (in/in) 1 x Per Week/30 Days Discharge Instructions: heel cup Secured With: Kerlix Roll Sterile or Non-Sterile 6-ply 4.5x4 (yd/yd) 1 x Per Week/30 Days Discharge Instructions: Apply Kerlix as directed 1. I would recommend that we have the patient going continue with the gentamicin Hydrofera Blue I think this is doing well I think that the biggest thing is he needs to make sure to be here every week so that we can clean out this wound and keep things from callusing over. 2. I am also going to recommend he continue to try to keep pressure off is much as possible pressure and frictioncentimeters at this point. We will see patient back for reevaluation in 1 week here in the clinic. If anything worsens or changes patient will contact our office for additional recommendations. Electronic Signature(s) Signed: 08/31/2023 2:11:03 PM By: Kevin Ferraris Avery Entered By: Kevin Ferraris on 08/31/2023 14:11:03 [name] ([id number]) [id number] Page 8 of 8 -------------------------------------------------------------------------------- SuperBill Details Patient Name: Date of Service: [name] 08/31/2023 Medical Record Number: [id number] Patient Account Number: [id number] Date of Birth/Sex: Treating RN: 04-10-2006 (18 y.o. 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

## 2023-08-31 NOTE — Progress Notes (Signed)
 [name] ([id number]) [id number] Page 1 of 8 Visit Report for 08/31/2023 Arrival Information Details Patient Name: Date of Service: [name] 08/31/2023 12:15 PM Medical Record Number: [id number] Patient Account Number: [id number] Date of Birth/Sex: Treating RN: 2006/07/21 (18 y.o. [name] Primary Care Huie Ghuman: PA SANDRA, NO Other Clinician: Referring Muath Hallam: Treating Charlesetta Milliron/Extender: Bethena Andre [name] Weeks in Treatment: 10 Visit Information History Since Last Visit Added or deleted any medications: No Patient Arrived: Ambulatory Any new allergies or adverse reactions: No Arrival Time: 12:21 Had a fall or experienced change in No Accompanied By: self activities of daily living that may affect Transfer Assistance: None risk of falls: Patient Identification Verified: Yes Signs or symptoms of abuse/neglect since last visito No Secondary Verification Process Completed: Yes Has Dressing in Place as Prescribed: Yes Patient Requires Transmission-Based Precautions: No Pain Present Now: No Patient Has Alerts: No Electronic Signature(s) Signed: 08/31/2023 2:05:44 PM By: Claudene Blossom MSN RN CNS WTA Entered By: Claudene Blossom on 08/31/2023 12:23:32 -------------------------------------------------------------------------------- Clinic Level of Care Assessment Details Patient Name: Date of Service: [name], [name] 08/31/2023 12:15 PM Medical Record Number: [id number] Patient Account Number: [id number] Date of Birth/Sex: Treating RN: 2006-03-25 (17 y.o. [name] Primary Care Melanni Benway: PA SANDRA, NO Other Clinician: Referring Cameran Pettey: Treating Verle Brillhart/Extender: Bethena Andre Kevin, [name] Weeks in Treatment: 10 Clinic Level of Care Assessment Items TOOL 1 Quantity Score []  - 0 Use when EandM and Procedure is performed on INITIAL visit ASSESSMENTS - Nursing Assessment / Reassessment []  - 0 General Physical Exam  (combine w/ comprehensive assessment (listed just below) when performed on new pt. evals) []  - 0 Comprehensive Assessment (HX, ROS, Risk Assessments, Wounds Hx, etc.) ASSESSMENTS - Wound and Skin Assessment / Reassessment []  - 0 Dermatologic / Skin Assessment (not related to wound area) ASSESSMENTS - Ostomy and/or Continence Assessment and Care [name], [name] ([id number]) [id number] Page 2 of 8 []  - 0 Incontinence Assessment and Management []  - 0 Ostomy Care Assessment and Management (repouching, etc.) PROCESS - Coordination of Care []  - 0 Simple Patient / Family Education for ongoing care []  - 0 Complex (extensive) Patient / Family Education for ongoing care []  - 0 Staff obtains Chiropractor, Records, T Results / Process Orders est []  - 0 Staff telephones HHA, Nursing Homes / Clarify orders / etc []  - 0 Routine Transfer to another Facility (non-emergent condition) []  - 0 Routine Hospital Admission (non-emergent condition) []  - 0 New Admissions / Manufacturing Engineer / Ordering NPWT Apligraf, etc. , []  - 0 Emergency Hospital Admission (emergent condition) PROCESS - Special Needs []  - 0 Pediatric / Minor Patient Management []  - 0 Isolation Patient Management []  - 0 Hearing / Language / Visual special needs []  - 0 Assessment of Community assistance (transportation, D/C planning, etc.) []  - 0 Additional assistance / Altered mentation []  - 0 Support Surface(s) Assessment (bed, cushion, seat, etc.) INTERVENTIONS - Miscellaneous []  - 0 External ear exam []  - 0 Patient Transfer (multiple staff / Nurse, Adult / Similar devices) []  - 0 Simple Staple / Suture removal (25 or less) []  - 0 Complex Staple / Suture removal (26 or more) []  - 0 Hypo/Hyperglycemic Management (do not check if billed separately) []  - 0 Ankle / Brachial Index (ABI) - do not check if billed separately Has the patient been seen at the hospital within the last three years:  Yes Total Score: 0 Level Of Care: ____ Electronic Signature(s) Signed: 08/31/2023 2:05:44 PM By: Claudene,  Orie MSN RN CNS WTA Entered By: Claudene Orie on 08/31/2023 12:39:30 -------------------------------------------------------------------------------- Encounter Discharge Information Details Patient Name: Date of Service: [name], [name] 08/31/2023 12:15 PM Medical Record Number: [id number] Patient Account Number: [id number] Date of Birth/Sex: Treating RN: 2005-09-24 (18 y.o. NETTY Claudene Orie Primary Care Suzetta Timko: PA SANDRA, NO Other Clinician: Referring Jomaira Darr: Treating Chealsea Paske/Extender: Bethena Andre [name] Weeks in Treatment: 10 Encounter Discharge Information Items Post Procedure Vitals Discharge Condition: Stable Temperature (F): 98.0 [name] ([id number]) [phone].pdf Page 3 of 8 Ambulatory Status: Ambulatory Pulse (bpm): 83 Discharge Destination: Home Respiratory Rate (breaths/min): 18 Transportation: Private Auto Blood Pressure (mmHg): 127/77 Accompanied By: self Schedule Follow-up Appointment: Yes Clinical Summary of Care: Electronic Signature(s) Signed: 08/31/2023 2:05:44 PM By: Claudene Orie MSN RN CNS WTA Entered By: Claudene Orie on 08/31/2023 12:41:38 -------------------------------------------------------------------------------- Lower Extremity Assessment Details Patient Name: Date of Service: [name], [name] 08/31/2023 12:15 PM Medical Record Number: [id number] Patient Account Number: [id number] Date of Birth/Sex: Treating RN: 2005/12/15 (17 y.o. NETTY Claudene Orie Primary Care Rolland Steinert: PA SANDRA, NO Other Clinician: Referring Ziare Orrick: Treating Krystalyn Kubota/Extender: Bethena Andre [name] Weeks in Treatment: 10 Electronic Signature(s) Signed: 08/31/2023 2:05:44 PM By: Claudene Orie MSN RN CNS WTA Entered By: Claudene Orie on 08/31/2023  12:29:32 -------------------------------------------------------------------------------- Multi Wound Chart Details Patient Name: Date of Service: [name], [name] 08/31/2023 12:15 PM Medical Record Number: [id number] Patient Account Number: [id number] Date of Birth/Sex: Treating RN: [birth year] (17 y.o. NETTY Claudene Orie Primary Care Timesha Cervantez: PA TIENT, NO Other Clinician: Referring Shakina Choy: Treating Cullen Vanallen/Extender: Bethena Andre Kevin, [name] Weeks in Treatment: 10 Vital Signs Height(in): 67 Pulse(bpm): 83 Weight(lbs): 130 Blood Pressure(mmHg): 127/77 Body Mass Index(BMI): 20.4 Temperature(F): 98.0 Respiratory Rate(breaths/min): 18 [2:Photos:] [N/A:N/A] Right Calcaneus N/A N/A Wound Location: Pressure Injury N/A N/A Wounding Event: Pressure Ulcer N/A N/A Primary Etiology: 06/16/2022 N/A N/A Date Acquired: 10 N/A N/A Weeks of Treatment: Open N/A N/A Wound Status: No N/A N/A Wound Recurrence: 0.5x0.5x0.3 N/A N/A Measurements L x W x D (cm) 0.196 N/A N/A A (cm) : rea 0.059 N/A N/A Volume (cm) : 76.20% N/A N/A % Reduction in A rea: 88.10% N/A N/A % Reduction in Volume: Category/Stage III N/A N/A Classification: Medium N/A N/A Exudate A mount: Sanguinous N/A N/A Exudate Type: red N/A N/A Exudate Color: Large (67-100%) N/A N/A Granulation A mount: Red, Pink N/A N/A Granulation Quality: None Present (0%) N/A N/A Necrotic A mount: Fat Layer (Subcutaneous Tissue): Yes N/A N/A Exposed Structures: Fascia: No Tendon: No Muscle: No Joint: No Bone: No Medium (34-66%) N/A N/A Epithelialization: Debridement - Selective/Open Wound N/A N/A Debridement: Callus N/A N/A Tissue Debrided: Non-Viable Tissue N/A N/A Level: 0.2 N/A N/A Debridement A (sq cm): rea Curette N/A N/A Instrument: Minimum N/A N/A Bleeding: Pressure N/A N/A Hemostasis A chieved: 0 N/A N/A Procedural Pain: 0 N/A N/A Post Procedural Pain: Debridement Treatment Response:  Procedure was tolerated well N/A N/A Post Debridement Measurements L x 0.5x0.5x0.3 N/A N/A W x D (cm) 0.059 N/A N/A Post Debridement Volume: (cm) Category/Stage III N/A N/A Post Debridement Stage: Debridement N/A N/A Procedures Performed: Treatment Notes Electronic Signature(s) Signed: 08/31/2023 2:05:44 PM By: Claudene Orie MSN RN CNS WTA Entered By: Claudene Orie on 08/31/2023 12:37:50 -------------------------------------------------------------------------------- Multi-Disciplinary Care Plan Details Patient Name: Date of Service: [name], [name] 08/31/2023 12:15 PM Medical Record Number: [id number] Patient Account Number: [id number] Date of Birth/Sex: Treating RN: 2006-06-11 (17 y.o. NETTY Claudene Orie Primary Care Nuchem Grattan: PA TIENT,  NO Other Clinician: Referring Justise Ehmann: Treating Brodyn Depuy/Extender: Bethena Andre [name], [name] Weeks in Treatment: [address] [place], [name] ([id number]) [id number] Page 5 of 8 Wound/Skin Impairment Nursing Diagnoses: Impaired tissue integrity Knowledge deficit related to ulceration/compromised skin integrity Goals: Patient/caregiver will verbalize understanding of skin care regimen Date Initiated: 06/18/2023 Date Inactivated: 07/22/2023 Target Resolution Date: 07/18/2023 Goal Status: Met Ulcer/skin breakdown will have a volume reduction of 30% by week 4 Date Initiated: 06/18/2023 Date Inactivated: 07/22/2023 Target Resolution Date: 07/18/2023 Goal Status: Met Ulcer/skin breakdown will have a volume reduction of 50% by week 8 Date Initiated: 06/18/2023 Date Inactivated: 08/31/2023 Target Resolution Date: 08/17/2023 Goal Status: Met Ulcer/skin breakdown will have a volume reduction of 80% by week 12 Date Initiated: 06/18/2023 Target Resolution Date: 09/17/2023 Goal Status: Active Ulcer/skin breakdown will heal within 14 weeks Date Initiated: 06/18/2023 Target Resolution Date: 10/01/2023 Goal Status:  Active Interventions: Assess patient/caregiver ability to obtain necessary supplies Assess patient/caregiver ability to perform ulcer/skin care regimen upon admission and as needed Assess ulceration(s) every visit Provide education on ulcer and skin care Treatment Activities: Skin care regimen initiated : 06/17/2023 Notes: Electronic Signature(s) Signed: 08/31/2023 2:05:44 PM By: Claudene Blossom MSN RN CNS WTA Entered By: Claudene Blossom on 08/31/2023 12:40:21 -------------------------------------------------------------------------------- Pain Assessment Details Patient Name: Date of Service: [name] 08/31/2023 12:15 PM Medical Record Number: [id number] Patient Account Number: [id number] Date of Birth/Sex: Treating RN: 03/22/2006 (18 y.o. [name] Primary Care Alix Stowers: PA SANDRA, NO Other Clinician: Referring Ran Tullis: Treating Algis Lehenbauer/Extender: Bethena Andre [name] Avery Weeks in Treatment: 10 Active Problems Location of Pain Severity and Description of Pain Patient Has Paino No Site Locations [place], [name] ([id number]) [id number] Page 6 of 8 Pain Management and Medication Current Pain Management: Electronic Signature(s) Signed: 08/31/2023 2:05:44 PM By: Claudene Blossom MSN RN CNS WTA Entered By: Claudene Blossom on 08/31/2023 12:24:01 -------------------------------------------------------------------------------- Patient/Caregiver Education Details Patient Name: Date of Service: [name] 1/6/2025andnbsp12:15 PM Medical Record Number: [id number] Patient Account Number: [id number] Date of Birth/Gender: Treating RN: Nov 16, 2005 (18 y.o. [name] Primary Care Physician: PA SANDRA, NO Other Clinician: Referring Physician: Treating Physician/Extender: Bethena Andre [name] Avery Weeks in Treatment: 10 Education Assessment Education Provided To: Patient Education Topics Provided Wound Debridement: Handouts: Wound  Debridement Methods: Explain/Verbal Responses: State content correctly Wound/Skin Impairment: Handouts: Caring for Your Ulcer Methods: Explain/Verbal Responses: State content correctly Electronic Signature(s) Signed: 08/31/2023 2:05:44 PM By: Claudene Blossom MSN RN CNS WTA Entered By: Claudene Blossom on 08/31/2023 12:40:37 Kevin [name] ([id number]) [id number] Page 7 of 8 -------------------------------------------------------------------------------- Wound Assessment Details Patient Name: Date of Service: [name], [name] 08/31/2023 12:15 PM Medical Record Number: [id number] Patient Account Number: [id number] Date of Birth/Sex: Treating RN: 2005-10-11 (18 y.o. [name] Primary Care Nigil Braman: PA TIENT, NO Other Clinician: Referring Oris Calmes: Treating [name]/Extender: Bethena Andre [name], [name] Weeks in Treatment: 10 Wound Status Wound Number: 2 Primary Etiology: Pressure Ulcer Wound Location: Right Calcaneus Wound Status: Open Wounding Event: Pressure Injury Date Acquired: 06/16/2022 Weeks Of Treatment: 10 Clustered Wound: No Photos Wound Measurements Length: (cm) 0.5 Width: (cm) 0.5 Depth: (cm) 0.3 Area: (cm) 0.196 Volume: (cm) 0.059 % Reduction in Area: 76.2% % Reduction in Volume: 88.1% Epithelialization: Medium (34-66%) Wound Description Classification: Category/Stage III Exudate Amount: Medium Exudate Type: Sanguinous Exudate Color: red Foul Odor After Cleansing: No Slough/Fibrino No Wound Bed Granulation Amount: Large (67-100%) Exposed Structure Granulation Quality: Red, Pink Fascia Exposed: No Necrotic Amount: None Present (0%) Fat Layer (Subcutaneous  Tissue) Exposed: Yes Tendon Exposed: No Muscle Exposed: No Joint Exposed: No Bone Exposed: No Treatment Notes Wound #2 (Calcaneus) Wound Laterality: Right Cleanser Soap and Water Discharge Instruction: Gently cleanse wound with antibacterial soap, rinse and pat dry prior  to dressing wounds Vashe 5.8 (oz) [name] ([id number]) [id number] Page 8 of 8 Discharge Instruction: Use vashe 5.8 (oz) as directed Peri-Wound Care Topical Gentamicin Discharge Instruction: Apply as directed by Quintan Saldivar. Primary Dressing Hydrofera Blue Ready Transfer Foam, 2.5x2.5 (in/in) Discharge Instruction: Apply Hydrofera Blue Ready to wound bed as directed Secondary Dressing ABD Pad 5x9 (in/in) Discharge Instruction: heel cup Secured With Kerlix Roll Sterile or Non-Sterile 6-ply 4.5x4 (yd/yd) Discharge Instruction: Apply Kerlix as directed Compression Wrap Compression Stockings Add-Ons Electronic Signature(s) Signed: 08/31/2023 2:05:44 PM By: Claudene Blossom MSN RN CNS WTA Entered By: Claudene Blossom on 08/31/2023 12:37:41 -------------------------------------------------------------------------------- Vitals Details Patient Name: Date of Service: [name] 08/31/2023 12:15 PM Medical Record Number: [id number] Patient Account Number: [id number] Date of Birth/Sex: Treating RN: [birth year] (17 y.o. [name] Primary Care Rowyn Mustapha: PA SANDRA, NO Other Clinician: Referring Benjamyn Hestand: Treating Trampas Stettner/Extender: Bethena Andre [name], [name] Weeks in Treatment: 10 Vital Signs Time Taken: 12:23 Temperature (F): 98.0 Height (in): 67 Pulse (bpm): 83 Weight (lbs): 130 Respiratory Rate (breaths/min): 18 Body Mass Index (BMI): 20.4 Blood Pressure (mmHg): 127/77 Reference Range: 80 - 120 mg / dl Electronic Signature(s) Signed: 08/31/2023 2:05:44 PM By: Claudene Blossom MSN RN CNS WTA Entered By: Claudene Blossom on 08/31/2023 12:23:55

## 2023-09-07 ENCOUNTER — Encounter: Payer: Medicaid Other | Admitting: Physician Assistant

## 2023-09-07 DIAGNOSIS — L89613 Pressure ulcer of right heel, stage 3: Secondary | ICD-10-CM | POA: Diagnosis not present

## 2023-09-09 NOTE — Progress Notes (Addendum)
 [name] ([id number]) [id number] Page 1 of 8 Visit Report for 09/07/2023 Chief Complaint Document Details Patient Name: Date of Service: [name] 09/07/2023 8:30 A M Medical Record Number: [id number] Patient Account Number: [id number] Date of Birth/Sex: Treating RN: 11-24-2005 (18 y.o. [name] Primary Care Provider: PA SANDRA, NO Other Clinician: Referring Provider: Treating Provider/Extender: Bethena Andre MARYANNE BRUNO Weeks in Treatment: 11 Information Obtained from: Patient Chief Complaint Pressure ulcer left foot due to brace 06/17/2023; patient is here for review of a pressure area on the right plantar heel Electronic Signature(s) Signed: 09/07/2023 8:46:23 AM By: Bethena Andre PA-C Entered By: Bethena Andre on 09/07/2023 08:46:23 -------------------------------------------------------------------------------- Debridement Details Patient Name: Date of Service: Kevin [name] 09/07/2023 8:30 A M Medical Record Number: [id number] Patient Account Number: [id number] Date of Birth/Sex: Treating RN: 03/01/2006 (18 y.o. [name] Primary Care Provider: PA SANDRA, NO Other Clinician: Referring Provider: Treating Provider/Extender: Bethena Andre MARYANNE BRUNO Weeks in Treatment: 11 Debridement Performed for Assessment: Wound #2 Right Calcaneus Performed By: Physician Bethena Andre, PA-C The following information was scribed by: Claudene Blossom The information was scribed for: Bethena Andre Debridement Type: Debridement Level of Consciousness (Pre-procedure): Awake and Alert Pre-procedure Verification/Time Out Yes - 09:16 Taken: Start Time: 09:16 Pain Control: Lidocaine 4% Topical Solution Percent of Wound Bed Debrided: 100% T Area Debrided (cm): otal 0.07 Tissue and other material debrided: Non-Viable, Callus, Slough, Subcutaneous, Slough Level: Skin/Subcutaneous Tissue Debridement Description: Excisional Instrument:  Curette Bleeding: None Procedural Pain: 0 [name] ([id number]) [id number] Page 2 of 8 Post Procedural Pain: 0 Response to Treatment: Procedure was tolerated well Level of Consciousness (Post- Awake and Alert procedure): Post Debridement Measurements of Total Wound Length: (cm) 0.3 Stage: Category/Stage III Width: (cm) 0.3 Depth: (cm) 0.3 Volume: (cm) 0.021 Character of Wound/Ulcer Post Debridement: Stable Post Procedure Diagnosis Same as Pre-procedure Electronic Signature(s) Signed: 09/07/2023 4:43:18 PM By: Bethena Andre PA-C Signed: 09/07/2023 4:49:55 PM By: Claudene Blossom MSN RN CNS WTA Entered By: Claudene Blossom on 09/07/2023 09:19:36 -------------------------------------------------------------------------------- HPI Details Patient Name: Date of Service: Kevin [name] 09/07/2023 8:30 A M Medical Record Number: [id number] Patient Account Number: [id number] Date of Birth/Sex: Treating RN: 12/26/2005 (18 y.o. [name] Primary Care Provider: PA SANDRA, NO Other Clinician: Referring Provider: Treating Provider/Extender: Bethena Andre MARYANNE, KRISTA Weeks in Treatment: 11 History of Present Illness HPI Description: 01/20/2020 upon evaluation today patient presents for initial inspection here in our clinic concerning issues that he has been having with his left foot laterally at the fifth metatarsal location. It appears that he has issues unfortunately with chronic rubbing on his brace. He does have spina bifida and he has issues with this fairly frequently. With that being said the good news is this does not appear to be infected. The bad news is it is a recurrent issue. He is can be having surgery for foot reconstruction June 8. 01/27/2020 on evaluation today patient appears to be doing about the same in regard to his wound at this point. Fortunately there is no signs of active infection. Overall I feel like he is actually managing quite well.  The wound is very macerated I think this is simply due to the fact that he has been using a Band-Aid as opposed to an absorptive bandage over top of the collagen. He really has nothing to drain into and then been putting over top of this Covan which is occluding it even more. 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 He wears an AFO brace on both feet. Noteworthy that he was here in 2021 for an area on the left foot He tells me that he has had a wound on the plantar right heel for the better part of a year. Prior to this this may have opened and closed repetitively. He has not been specifically dressing this and he became alarmed when it would not close and also that there may have been an odor noticeable. He is here for a review of this. 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 ABI in our clinic was 1.22 on the right 10/30; x-ray of the right heel was normal no osseous destruction. His PCR culture that I did last week showed E. coli various anaerobes and MSSA. We have been using Hydrofera Blue and a heel offloading boot 07-01-2023 upon evaluation today patient appears to be doing decently well currently in regard to his wound. Fortunately there does not appear to be any signs of active infection at this time. This is a gentleman whom I have seen previously about 3 years ago. He actually is doing decently well at this point he did have some debridement last week with Dr. Rufus he said to have a cast today he will be come back on Friday to change this out. 07-03-2023 upon evaluation today patient appears to be doing well  currently in regard to his wound. He has been tolerating the dressing changes without complication. Fortunately there does not appear to be any signs of infection which is good news I think the cast that a great job. He is here for the first cast STATON, MARKEY ([id number]) [id number] Page 3 of 8 change to ensure nothing was rubbing the medial part of his ankle was the only place in question he thinks a bigger boot could potentially benefit him there. 07-08-2023 patient's wound currently is showing signs of good improvement I am actually very pleased with where we stand is already measuring smaller and looking better I feel that the depth is filling in and I am very pleased in that regard. I do not see any signs of infection the cast seems to be doing well with this and make sure to pad his ankle area to make sure that nothing is rubbing in this region. 07-16-2023 upon evaluation today patient's heel ulcer has not made quite as much improvement as I like to see although a lot of the callus has loosened up around the edges of the wound has become obvious that I can clear away a lot more this callus and what I previously was able to with the being softer. For that reason I did go ahead and at this point perform debridement clearway necrotic debris and the patient tolerated this today without complication. Postdebridement the wound bed actually appears to be doing much better. 07-22-2023 upon evaluation today patient appears to be doing well currently in regard to his wound although I am a little concerned about how the cast is pushing on the side of his ankle. The biggest issue that I see is simply that it is when his ankle being so  why it is put into the boot that it does not quite fit and it therefore pushes a lot inward on that medial ankle. I think that if he can use his walker and not have to use the black part of the outer boot for his cast that we will take care of the  issue and prevent this from causing problems he tells me has been very active doing a lot of walking I think that is been a big part of the issue. 07-27-2023 upon evaluation today patient appears to be doing excellent in regard to his wounds. He has been tolerating the dressing changes without complication. Fortunately there does not appear to be any signs of active infection at this time which is great news and overall there is no need for sharp debridement today. Fortunately the patient seems to be making really good headway here towards closure and in general I think that we are doing extremely well currently. No debridement means we will not make this any larger and in turn I think we should see this be a lot smaller come next week even I am extremely pleased with where things stand. 08-06-2023 upon evaluation today patient appears to be doing well currently in regard to his wound which is showing signs of improvement this is still slowed going but nonetheless is a little bit smaller than it was last week it definitely looks much better than it was last week. 08-13-2023 upon evaluation today patient appears to be doing well currently in regard to his wound he does have a little bit of callus buildup and some dried drainage as well we can go and see what we can do to help out with that. 08-31-23 upon evaluation patient's foot wound actually appears to be very callused. I do think that we can have to perform some debridement here to clear this away and he is in agreement with the plan. Fortunately I do not see any signs of active infection at this time. 09-06-2022 upon evaluation today patient appears to be doing well currently in regard to his wound he has been staying off of this and seems to be doing excellent. Fortunately I do not see any evidence of active infection at this time. No fevers, chills, nausea, vomiting, or diarrhea. Electronic Signature(s) Signed: 09/07/2023 9:23:58 AM By: Bethena Ferraris  PA-C Entered By: Bethena Ferraris on 09/07/2023 09:23:58 -------------------------------------------------------------------------------- Physical Exam Details Patient Name: Date of Service: [name] 09/07/2023 8:30 A M Medical Record Number: [id number] Patient Account Number: [id number] Date of Birth/Sex: Treating RN: [birth year] (18 y.o. [name] Primary Care Provider: PA SANDRA, NO Other Clinician: Referring Provider: Treating Provider/Extender: Bethena Ferraris SMOCK, KRISTA Weeks in Treatment: 11 Constitutional Well-nourished and well-hydrated in no acute distress. Respiratory normal breathing without difficulty. Psychiatric this patient is able to make decisions and demonstrates good insight into disease process. Alert and Oriented x 3. pleasant and cooperative. Notes Upon inspection patient's wound bed actually showed signs of good granulation epithelization at this point. Fortunately I do not see any evidence of worsening I believe that the patient is making good headway here towards closure. I think he is doing a good job offloading which is excellent news. Electronic Signature(s) Signed: 09/07/2023 9:24:15 AM By: Bethena Ferraris PA-C Entered By: Bethena Ferraris on 09/07/2023 09:24:15 [name] ([id number]) [id number] Page 4 of 8 -------------------------------------------------------------------------------- Physician Orders Details Patient Name: Date of Service: [name] 09/07/2023 8:30 A M Medical Record Number:  [id number] Patient Account Number: [id number] Date of Birth/Sex: Treating RN: [birth year] (18 y.o. 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 Bathing/ Shower/ Hygiene Clean wound with Normal Saline or wound cleanser. Anesthetic (Use 'Patient Medications' Section for Anesthetic Order Entry) Lidocaine applied to wound bed Off-Loading Heel suspension boot Wound Treatment Wound #2 - Calcaneus Wound Laterality: Right Cleanser: Soap and Water 1 x Per Week/30 Days Discharge Instructions: Gently cleanse wound with antibacterial soap, rinse and pat dry prior to dressing wounds Cleanser: Vashe 5.8 (oz) 1 x Per Week/30 Days Discharge Instructions: Use vashe 5.8 (oz) as directed Prim Dressing: Endoform Natural, Non-fenestrated, 2x2 (in/in) ary 1 x Per Week/30 Days Secondary Dressing: ABD Pad 5x9 (in/in) 1 x Per Week/30 Days Discharge Instructions: heel cup Secured With: Kerlix Roll Sterile or Non-Sterile 6-ply 4.5x4 (yd/yd) 1 x Per Week/30 Days Discharge Instructions: Apply Kerlix as directed Electronic Signature(s) Signed: 09/07/2023 4:43:18 PM By: Bethena Andre PA-C Signed: 09/07/2023 4:49:55 PM By: Claudene Blossom MSN RN CNS WTA Entered By: Claudene Blossom on 09/07/2023 09:22:27 [name] ([id number]) [id number] Page 5 of 8 -------------------------------------------------------------------------------- Problem List Details Patient Name: Date of Service: [name] 09/07/2023 8:30 A M Medical Record Number: [id number] Patient Account Number: [id number] Date of Birth/Sex: Treating RN: 12-04-2005 (18 y.o. 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 [name] Primary Care Provider: PA SANDRA, NO Other Clinician: Referring Provider: Treating Provider/Extender: Bethena Andre MARYANNE BRUNO Weeks in Treatment: 11 Subjective Chief Complaint Information obtained from Patient Pressure ulcer left foot due to brace 06/17/2023; patient is here for review of a pressure area on the right plantar heel History of Present Illness (HPI) 01/20/2020 upon evaluation today patient presents for initial inspection here in our clinic concerning issues that he has been having with his left foot laterally at [place] ([id number]) [id number] Page 6 of 8 the fifth metatarsal location. It appears that he has issues unfortunately with chronic rubbing on his brace. He does have spina bifida and he has issues with this fairly frequently. With that being said the good news is this does not appear to be infected. The bad news is it is a recurrent issue. He is can be having surgery for foot reconstruction June 8. 01/27/2020 on evaluation today patient appears to be doing about the same in regard to his wound at this point. Fortunately there is no signs of active infection. Overall I feel like he is actually managing quite well. The wound is very macerated I think this is simply due to  the fact that he has been using a Band-Aid as opposed to an absorptive bandage over top of the collagen. He really has nothing to drain into and then  been putting over top of this Covan which is occluding it even more. 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 He wears an AFO brace on both feet. Noteworthy that he was here in 2021 for an area on the left foot He tells me that he has had a wound on the plantar right heel for the better part of a year. Prior to this this may have opened and closed repetitively. He has not been specifically dressing this and he became alarmed when it would not close and also that there may have been an odor noticeable. He is here for a review of this. 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 ABI in our clinic was 1.22 on the right 10/30; x-ray of the right heel was normal no osseous destruction. His PCR culture that I did last week showed E. coli various anaerobes and MSSA. We have been using Hydrofera Blue and a heel offloading boot 07-01-2023 upon evaluation today patient appears to be doing decently well currently in regard to his wound. Fortunately there does not appear to be any signs of active infection at this time. This is a gentleman whom I have seen previously about 3 years ago. He actually is doing decently well at this point he did have some debridement last week with Dr. Rufus he said to have a cast today he will be come back on Friday to change this out. 07-03-2023 upon evaluation today patient appears to be doing well currently in regard to his wound. He has been tolerating the dressing changes without complication. Fortunately there does not appear to be any signs of infection which is good news I think the cast that a great  job. He is here for the first cast change to ensure nothing was rubbing the medial part of his ankle was the only place in question he thinks a bigger boot could potentially benefit him there. 07-08-2023 patient's wound currently is showing signs of good improvement I am actually very pleased with where we stand is already measuring smaller and looking better I feel that the depth is filling in and I am very pleased in that regard. I do not see any signs of infection the cast seems to be doing well with this and make sure to pad his ankle area to make sure that nothing is rubbing in this region. 07-16-2023 upon evaluation today patient's heel ulcer has not made quite as much improvement as I like to see although a lot of the callus has loosened up around the edges of the wound has become obvious that I can clear away a lot more this callus and what I previously was able to with the being softer. For that reason I did go ahead and at this point perform debridement clearway necrotic debris and the patient tolerated this today without complication. Postdebridement the wound bed actually appears to be doing much better. 07-22-2023 upon evaluation today patient appears to be doing well currently in regard  to his wound although I am a little concerned about how the cast is pushing on the side of his ankle. The biggest issue that I see is simply that it is when his ankle being so why it is put into the boot that it does not quite fit and it therefore pushes a lot inward on that medial ankle. I think that if he can use his walker and not have to use the black part of the outer boot for his cast that we will take care of the issue and prevent this from causing problems he tells me has been very active doing a lot of walking I think that is been a big part of the issue. 07-27-2023 upon evaluation today patient appears to be doing excellent in regard to his wounds. He has been tolerating the dressing changes  without complication. Fortunately there does not appear to be any signs of active infection at this time which is great news and overall there is no need for sharp debridement today. Fortunately the patient seems to be making really good headway here towards closure and in general I think that we are doing extremely well currently. No debridement means we will not make this any larger and in turn I think we should see this be a lot smaller come next week even I am extremely pleased with where things stand. 08-06-2023 upon evaluation today patient appears to be doing well currently in regard to his wound which is showing signs of improvement this is still slowed going but nonetheless is a little bit smaller than it was last week it definitely looks much better than it was last week. 08-13-2023 upon evaluation today patient appears to be doing well currently in regard to his wound he does have a little bit of callus buildup and some dried drainage as well we can go and see what we can do to help out with that. 08-31-23 upon evaluation patient's foot wound actually appears to be very callused. I do think that we can have to perform some debridement here to clear this away and he is in agreement with the plan. Fortunately I do not see any signs of active infection at this time. 09-06-2022 upon evaluation today patient appears to be doing well currently in regard to his wound he has been staying off of this and seems to be doing excellent. Fortunately I do not see any evidence of active infection at this time. No fevers, chills, nausea, vomiting, or diarrhea. Objective Constitutional Well-nourished and well-hydrated in no acute distress. Vitals Time Taken: 8:43 AM, Height: 67 in, Weight: 130 lbs, BMI: 20.4, Temperature: 97.4 F, Pulse: 65 bpm, Respiratory Rate: 18 breaths/min, Blood Pressure: 130/73 mmHg. Respiratory normal breathing without difficulty. Psychiatric this patient is able to make decisions  and demonstrates good insight into disease process. Alert and Oriented x 3. pleasant and cooperative. [name] ([id number]) [id number] Page 7 of 8 General Notes: Upon inspection patient's wound bed actually showed signs of good granulation epithelization at this point. Fortunately I do not see any evidence of worsening I believe that the patient is making good headway here towards closure. I think he is doing a good job offloading which is excellent news. Integumentary (Hair, Skin) Wound #2 status is Open. Original cause of wound was Pressure Injury. The date acquired was: 06/16/2022. The wound has been in treatment 11 weeks. The wound is located on the Right Calcaneus. The wound measures 0.3cm length x 0.3cm width x 0.3cm depth; 0.071cm^2  area and 0.021cm^3 volume. There is Fat Layer (Subcutaneous Tissue) exposed. There is a medium amount of sanguinous drainage noted. There is large (67-100%) red, pink granulation within the wound bed. There is no necrotic tissue within the wound bed. Assessment Active Problems ICD-10 Pressure ulcer of right heel, stage 3 Lumbar spina bifida without hydrocephalus Procedures Wound #2 Pre-procedure diagnosis of Wound #2 is a Pressure Ulcer located on the Right Calcaneus . There was a Excisional Skin/Subcutaneous Tissue Debridement with a total area of 0.07 sq cm performed by Bethena Ferraris, PA-C. With the following instrument(s): Curette to remove Non-Viable tissue/material. Material removed includes Callus, Subcutaneous Tissue, and Slough after achieving pain control using Lidocaine 4% T opical Solution. No specimens were taken. A time out was conducted at 09:16, prior to the start of the procedure. There was no bleeding. The procedure was tolerated well with a pain level of 0 throughout and a pain level of 0 following the procedure. Post Debridement Measurements: 0.3cm length x 0.3cm width x 0.3cm depth; 0.021cm^3 volume. Post  debridement Stage noted as Category/Stage III. Character of Wound/Ulcer Post Debridement is stable. Post procedure Diagnosis Wound #2: Same as Pre-Procedure Plan Follow-up Appointments: Return Appointment in 1 week. Bathing/ Shower/ Hygiene: Clean wound with Normal Saline or wound cleanser. Anesthetic (Use 'Patient Medications' Section for Anesthetic Order Entry): Lidocaine applied to wound bed Off-Loading: Heel suspension boot WOUND #2: - Calcaneus Wound Laterality: Right Cleanser: Soap and Water 1 x Per Week/30 Days Discharge Instructions: Gently cleanse wound with antibacterial soap, rinse and pat dry prior to dressing wounds Cleanser: Vashe 5.8 (oz) 1 x Per Week/30 Days Discharge Instructions: Use vashe 5.8 (oz) as directed Prim Dressing: Endoform Natural, Non-fenestrated, 2x2 (in/in) 1 x Per Week/30 Days ary Secondary Dressing: ABD Pad 5x9 (in/in) 1 x Per Week/30 Days Discharge Instructions: heel cup Secured With: Kerlix Roll Sterile or Non-Sterile 6-ply 4.5x4 (yd/yd) 1 x Per Week/30 Days Discharge Instructions: Apply Kerlix as directed 1. I would recommend that we have the patient continue to monitor for any signs of infection or worsening. Based on what I see I do believe that he is doing quite well at this point using endoform I think this is doing excellent. 2. I am good recommend as well the patient should continue the ABD pad. 3. I am also going to recommend recommend that he should continue with the roll gauze to secure in place. We will see patient back for reevaluation in 1 week here in the clinic. If anything worsens or changes patient will contact our office for additional recommendations. Electronic Signature(s) Signed: 09/07/2023 9:24:48 AM By: Bethena Ferraris PA-C Entered By: Bethena Ferraris on 09/07/2023 09:24:48 [name] ([id number]) [id number] Page 8 of  8 -------------------------------------------------------------------------------- SuperBill Details Patient Name: Date of Service: [name] 09/07/2023 Medical Record Number: [id number] Patient Account Number: [id number] Date of Birth/Sex: Treating RN: May 10, 2006 (18 y.o. 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

## 2023-09-09 NOTE — Progress Notes (Signed)
 CRECENCIO HUA ([id number]) [id number] Page 1 of 8 Visit Report for 09/07/2023 Arrival Information Details Patient Name: Date of Service: [name] 09/07/2023 8:30 A M Medical Record Number: [id number] Patient Account Number: [id number] Date of Birth/Sex: Treating RN: [birth year] (18 y.o. [name] Primary Care Valincia Touch: PA TIENT, NO Other Clinician: Referring Apurva Reily: Treating Brigetta Beckstrom/Extender: Bethena Andre MARYANNE BRUNO Weeks in Treatment: 11 Visit Information History Since Last Visit Added or deleted any medications: No Patient Arrived: Ambulatory Any new allergies or adverse reactions: No Arrival Time: 08:40 Had a fall or experienced change in No Accompanied By: self activities of daily living that may affect Transfer Assistance: None risk of falls: Patient Identification Verified: Yes Signs or symptoms of abuse/neglect since last visito No Secondary Verification Process Completed: Yes Hospitalized since last visit: No Patient Requires Transmission-Based Precautions: No Has Dressing in Place as Prescribed: Yes Patient Has Alerts: No Pain Present Now: No Electronic Signature(s) Signed: 09/07/2023 4:49:55 PM By: Claudene Blossom MSN RN CNS WTA Entered By: Claudene Blossom on 09/07/2023 09:16:20 -------------------------------------------------------------------------------- Clinic Level of Care Assessment Details Patient Name: Date of Service: [name] 09/07/2023 8:30 A M Medical Record Number: [id number] Patient Account Number: [id number] Date of Birth/Sex: Treating RN: 12/29/2005 (18 y.o. [name] Primary Care Deshunda Thackston: PA SANDRA, NO Other Clinician: Referring Deshundra Waller: Treating Gricel Copen/Extender: Bethena Andre MARYANNE BRUNO Weeks in Treatment: 11 Clinic Level of Care Assessment Items TOOL 1 Quantity Score []  - 0 Use when EandM and Procedure is performed on INITIAL visit ASSESSMENTS - Nursing Assessment /  Reassessment []  - 0 General Physical Exam (combine w/ comprehensive assessment (listed just below) when performed on new pt. evals) []  - 0 Comprehensive Assessment (HX, ROS, Risk Assessments, Wounds Hx, etc.) ASSESSMENTS - Wound and Skin Assessment / Reassessment []  - 0 Dermatologic / Skin Assessment (not related to wound area) [facility] ([id number]) [id number] Page 2 of 8 ASSESSMENTS - Ostomy and/or Continence Assessment and Care []  - 0 Incontinence Assessment and Management []  - 0 Ostomy Care Assessment and Management (repouching, etc.) PROCESS - Coordination of Care []  - 0 Simple Patient / Family Education for ongoing care []  - 0 Complex (extensive) Patient / Family Education for ongoing care []  - 0 Staff obtains Chiropractor, Records, T Results / Process Orders est []  - 0 Staff telephones HHA, Nursing Homes / Clarify orders / etc []  - 0 Routine Transfer to another Facility (non-emergent condition) []  - 0 Routine Hospital Admission (non-emergent condition) []  - 0 New Admissions / Manufacturing Engineer / Ordering NPWT Apligraf, etc. , []  - 0 Emergency Hospital Admission (emergent condition) PROCESS - Special Needs []  - 0 Pediatric / Minor Patient Management []  - 0 Isolation Patient Management []  - 0 Hearing / Language / Visual special needs []  - 0 Assessment of Community assistance (transportation, D/C planning, etc.) []  - 0 Additional assistance / Altered mentation []  - 0 Support Surface(s) Assessment (bed, cushion, seat, etc.) INTERVENTIONS - Miscellaneous []  - 0 External ear exam []  - 0 Patient Transfer (multiple staff / Nurse, Adult / Similar devices) []  - 0 Simple Staple / Suture removal (25 or less) []  - 0 Complex Staple / Suture removal (26 or more) []  - 0 Hypo/Hyperglycemic Management (do not check if billed separately) []  - 0 Ankle / Brachial Index (ABI) - do not check if billed separately Has the patient been seen at  the hospital within the last three years: Yes Total Score: 0 Level Of Care: ____ Electronic  Signature(s) Signed: 09/07/2023 4:49:55 PM By: Claudene Blossom MSN RN CNS WTA Entered By: Claudene Blossom on 09/07/2023 09:22:33 -------------------------------------------------------------------------------- Encounter Discharge Information Details Patient Name: Date of Service: [name] 09/07/2023 8:30 A M Medical Record Number: [id number] Patient Account Number: [id number] Date of Birth/Sex: Treating RN: [birth year] (18 y.o. [name] Primary Care Fay Swider: PA SANDRA, NO Other Clinician: Referring Takeo Harts: Treating Velma Agnes/Extender: Bethena Andre MARYANNE BRUNO Weeks in Treatment: 44 Encounter Discharge Information Items Post Procedure [place] ([id number]) [id number] Page 3 of 8 Discharge Condition: Stable Temperature (F): 97.4 Ambulatory Status: Ambulatory Pulse (bpm): 65 Discharge Destination: Home Respiratory Rate (breaths/min): 18 Transportation: Private Auto Blood Pressure (mmHg): 130/70 Accompanied By: self Schedule Follow-up Appointment: Yes Clinical Summary of Care: Electronic Signature(s) Signed: 09/07/2023 4:49:55 PM By: Claudene Blossom MSN RN CNS WTA Entered By: Claudene Blossom on 09/07/2023 09:31:14 -------------------------------------------------------------------------------- Lower Extremity Assessment Details Patient Name: Date of Service: [name] 09/07/2023 8:30 A M Medical Record Number: [id number] Patient Account Number: [id number] Date of Birth/Sex: Treating RN: 2005/11/24 (18 y.o. [name] Primary Care Zoe Nordin: PA SANDRA, NO Other Clinician: Referring Kristeena Meineke: Treating Aliyana Dlugosz/Extender: Bethena Andre MARYANNE BRUNO Weeks in Treatment: 11 Electronic Signature(s) Signed: 09/07/2023 4:49:55 PM By: Claudene Blossom MSN RN CNS WTA Entered By: Claudene Blossom on 09/07/2023  09:16:34 -------------------------------------------------------------------------------- Multi Wound Chart Details Patient Name: Date of Service: [name] 09/07/2023 8:30 A M Medical Record Number: [id number] Patient Account Number: [id number] Date of Birth/Sex: Treating RN: 2006/05/02 (17 y.o. [name] Primary Care Chason Mciver: PA TIENT, NO Other Clinician: Referring Cornelious Bartolucci: Treating Shina Wass/Extender: Bethena Andre MARYANNE, KRISTA Weeks in Treatment: 11 Vital Signs Height(in): 67 Pulse(bpm): 65 Weight(lbs): 130 Blood Pressure(mmHg): 130/73 Body Mass Index(BMI): 20.4 Temperature(F): 97.4 Respiratory Rate(breaths/min): 18 [2:Photos:] [N/A:N/A] Right Calcaneus N/A N/A Wound Location: Pressure Injury N/A N/A Wounding Event: Pressure Ulcer N/A N/A Primary Etiology: 06/16/2022 N/A N/A Date Acquired: 11 N/A N/A Weeks of Treatment: Open N/A N/A Wound Status: No N/A N/A Wound Recurrence: 0.3x0.3x0.3 N/A N/A Measurements L x W x D (cm) 0.071 N/A N/A A (cm) : rea 0.021 N/A N/A Volume (cm) : 91.40% N/A N/A % Reduction in A rea: 95.80% N/A N/A % Reduction in Volume: Category/Stage III N/A N/A Classification: Medium N/A N/A Exudate A mount: Sanguinous N/A N/A Exudate Type: red N/A N/A Exudate Color: Large (67-100%) N/A N/A Granulation A mount: Red, Pink N/A N/A Granulation Quality: None Present (0%) N/A N/A Necrotic A mount: Fat Layer (Subcutaneous Tissue): Yes N/A N/A Exposed Structures: Fascia: No Tendon: No Muscle: No Joint: No Bone: No Medium (34-66%) N/A N/A Epithelialization: Treatment Notes Electronic Signature(s) Signed: 09/07/2023 4:49:55 PM By: Claudene Blossom MSN RN CNS WTA Entered By: Claudene Blossom on 09/07/2023 09:16:39 -------------------------------------------------------------------------------- Multi-Disciplinary Care Plan Details Patient Name: Date of Service: Kevin [name] 09/07/2023 8:30 A M Medical Record Number:  [id number] Patient Account Number: [id number] Date of Birth/Sex: Treating RN: May 02, 2006 (17 y.o. [name] Primary Care Reve Crocket: PA SANDRA, NO Other Clinician: Referring Reginal Wojcicki: Treating Nyjae Hodge/Extender: Bethena Andre MARYANNE, KRISTA Weeks in Treatment: 11 Active Inactive Wound/Skin Impairment Nursing Diagnoses: Impaired tissue integrity Knowledge deficit related to ulceration/compromised skin integrity Goals: Patient/caregiver will verbalize understanding of skin care regimen Date Initiated: 06/18/2023 Date Inactivated: 07/22/2023 Target Resolution Date: 07/18/2023 Goal Status: Met Ulcer/skin breakdown will have a volume reduction of 30% by week 4 CRECENCIO HUA ([id number]) [id number] Page 5 of 8 Date Initiated: 06/18/2023 Date Inactivated: 07/22/2023  Target Resolution Date: 07/18/2023 Goal Status: Met Ulcer/skin breakdown will have a volume reduction of 50% by week 8 Date Initiated: 06/18/2023 Date Inactivated: 08/31/2023 Target Resolution Date: 08/17/2023 Goal Status: Met Ulcer/skin breakdown will have a volume reduction of 80% by week 12 Date Initiated: 06/18/2023 Target Resolution Date: 09/17/2023 Goal Status: Active Ulcer/skin breakdown will heal within 14 weeks Date Initiated: 06/18/2023 Target Resolution Date: 10/01/2023 Goal Status: Active Interventions: Assess patient/caregiver ability to obtain necessary supplies Assess patient/caregiver ability to perform ulcer/skin care regimen upon admission and as needed Assess ulceration(s) every visit Provide education on ulcer and skin care Treatment Activities: Skin care regimen initiated : 06/17/2023 Notes: Electronic Signature(s) Signed: 09/07/2023 4:49:55 PM By: Claudene Blossom MSN RN CNS WTA Entered By: Claudene Blossom on 09/07/2023 09:22:49 -------------------------------------------------------------------------------- Pain Assessment Details Patient Name: Date of Service: Kevin [name] 09/07/2023 8:30 A M Medical Record Number: [id number] Patient Account Number: [id number] Date of Birth/Sex: Treating RN: 11/03/2005 (18 y.o. [name] Primary Care Lakishia Bourassa: PA SANDRA, NO Other Clinician: Referring Durrell Barajas: Treating Leshawn Straka/Extender: Bethena Andre MARYANNE BRUNO Weeks in Treatment: 11 Active Problems Location of Pain Severity and Description of Pain Patient Has Paino No Site Locations Pain Management and Medication Current Pain Management: ARIN, PERAL ([id number]) [id number] Page 6 of 8 Electronic Signature(s) Signed: 09/07/2023 4:49:55 PM By: Claudene Blossom MSN RN CNS WTA Entered By: Claudene Blossom on 09/07/2023 09:16:26 -------------------------------------------------------------------------------- Patient/Caregiver Education Details Patient Name: Date of Service: [name] 1/13/2025andnbsp8:30 A M Medical Record Number: [id number] Patient Account Number: [id number] Date of Birth/Gender: Treating RN: [birth year] (18 y.o. [name] Primary Care Physician: PA SANDRA, NO Other Clinician: Referring Physician: Treating Physician/Extender: Bethena Andre MARYANNE BRUNO Weeks in Treatment: 11 Education Assessment Education Provided To: Patient Education Topics Provided Wound/Skin Impairment: Handouts: Caring for Your Ulcer Methods: Explain/Verbal Responses: State content correctly Electronic Signature(s) Signed: 09/07/2023 4:49:55 PM By: Claudene Blossom MSN RN CNS WTA Entered By: Claudene Blossom on 09/07/2023 09:22:58 -------------------------------------------------------------------------------- Wound Assessment Details Patient Name: Date of Service: Kevin [name] 09/07/2023 8:30 A M Medical Record Number: [id number] Patient Account Number: [id number] Date of Birth/Sex: Treating RN: 04-Jun-2006 (17 y.o. [name] Primary Care Lakrisha Iseman: PA SANDRA, NO Other Clinician: Referring Aujanae Mccullum: Treating  Carver Murakami/Extender: Bethena Andre MARYANNE, KRISTA Weeks in Treatment: 11 Wound Status Wound Number: 2 Primary Etiology: Pressure Ulcer Wound Location: Right Calcaneus Wound Status: Open Wounding Event: Pressure Injury Date Acquired: 06/16/2022 CRECENCIO HUA ([id number]) [id number] Page 7 of 8 Weeks Of Treatment: 11 Clustered Wound: No Photos Wound Measurements Length: (cm) 0.3 Width: (cm) 0.3 Depth: (cm) 0.3 Area: (cm) 0.071 Volume: (cm) 0.021 % Reduction in Area: 91.4% % Reduction in Volume: 95.8% Epithelialization: Medium (34-66%) Wound Description Classification: Category/Stage III Exudate Amount: Medium Exudate Type: Sanguinous Exudate Color: red Foul Odor After Cleansing: No Slough/Fibrino No Wound Bed Granulation Amount: Large (67-100%) Exposed Structure Granulation Quality: Red, Pink Fascia Exposed: No Necrotic Amount: None Present (0%) Fat Layer (Subcutaneous Tissue) Exposed: Yes Tendon Exposed: No Muscle Exposed: No Joint Exposed: No Bone Exposed: No Treatment Notes Wound #2 (Calcaneus) Wound Laterality: Right Cleanser Soap and Water Discharge Instruction: Gently cleanse wound with antibacterial soap, rinse and pat dry prior to dressing wounds Vashe 5.8 (oz) Discharge Instruction: Use vashe 5.8 (oz) as directed Peri-Wound Care Topical Primary Dressing Endoform Natural, Non-fenestrated, 2x2 (in/in) Secondary Dressing ABD Pad 5x9 (in/in) Discharge Instruction: heel cup Secured With Kerlix Roll Sterile or Non-Sterile 6-ply 4.5x4 (yd/yd) Discharge Instruction: Apply  Kerlix as directed Compression Wrap Compression Stockings Add-Ons Electronic Signature(s) Signed: 09/07/2023 4:49:55 PM By: Claudene Blossom MSN RN CNS WTA Entered By: Claudene Blossom on 09/07/2023 08:48:57 [facility]ENC[facility] ([id number]) [id number] Page 8 of  8 -------------------------------------------------------------------------------- Vitals Details Patient Name: Date of Service: [name] 09/07/2023 8:30 A M Medical Record Number: [id number] Patient Account Number: [id number] Date of Birth/Sex: Treating RN: [birth year] (18 y.o. [name] Primary Care Cashus Halterman: PA SANDRA, NO Other Clinician: Referring Meeya Goldin: Treating Kaliah Haddaway/Extender: Bethena Andre SMOCK, KRISTA Weeks in Treatment: 11 Vital Signs Time Taken: 08:43 Temperature (F): 97.4 Height (in): 67 Pulse (bpm): 65 Weight (lbs): 130 Respiratory Rate (breaths/min): 18 Body Mass Index (BMI): 20.4 Blood Pressure (mmHg): 130/73 Reference Range: 80 - 120 mg / dl Electronic Signature(s) Signed: 09/07/2023 4:49:55 PM By: Claudene Blossom MSN RN CNS WTA Entered By: Claudene Blossom on 09/07/2023 09:16:22

## 2023-09-14 ENCOUNTER — Encounter: Payer: Medicaid Other | Admitting: Physician Assistant

## 2023-09-14 DIAGNOSIS — L89613 Pressure ulcer of right heel, stage 3: Secondary | ICD-10-CM | POA: Diagnosis not present

## 2023-09-14 NOTE — Progress Notes (Signed)
[name] ([id number]) [id number] Page 1 of 7 Visit Report for 09/14/2023 Chief Complaint Document Details Patient Name: Date of Service: [name] 09/14/2023 12:45 PM Medical Record Number: [id number] Patient Account Number: [id number] Date of Birth/Sex: Treating RN: 05/10/2006 (18 y.o. [name]) Yevonne Pax Primary Care Provider: PA Zenovia Jordan, NO Other Clinician: Betha Loa Referring Provider: Treating Provider/Extender: Elizebeth Brooking, KRISTA Weeks in Treatment: 12 Information Obtained from: Patient Chief Complaint Pressure ulcer left foot due to brace 06/17/2023; patient is here for review of a pressure area on the right plantar heel Electronic Signature(s) Signed: 09/14/2023 12:47:45 PM By: Allen Derry PA-C Entered By: Allen Derry on 09/14/2023 12:47:45 -------------------------------------------------------------------------------- Debridement Details Patient Name: Date of Service: [name] 09/14/2023 12:45 PM Medical Record Number: [id number] Patient Account Number: [id number] Date of Birth/Sex: Treating RN: 14-Apr-2006 (18 y.o. [name]) Yevonne Pax Primary Care Provider: PA Zenovia Jordan, NO Other Clinician: Betha Loa Referring Provider: Treating Provider/Extender: Elizebeth Brooking, KRISTA Weeks in Treatment: 12 Debridement Performed for Assessment: Wound #2 Right Calcaneus Performed By: Physician Allen Derry, PA-C The following information was scribed by: Betha Loa The information was scribed for: Allen Derry Debridement Type: Debridement Level of Consciousness (Pre-procedure): Awake and Alert Pre-procedure Verification/Time Out Yes - 13:16 Taken: Start Time: 13:16 Percent of Wound Bed Debrided: 100% T Area Debrided (cm): otal 0.05 Tissue and other material debrided: Viable, Non-Viable, Callus Level: Non-Viable Tissue Debridement Description: Selective/Open Wound Instrument: Curette Bleeding: Minimum Hemostasis  Achieved: Pressure Response to Treatment: Procedure was tolerated well [name] ([id number]) [phone].pdf Page 2 of 7 Level of Consciousness (Post- Awake and Alert procedure): Post Debridement Measurements of Total Wound Length: (cm) 0.3 Stage: Category/Stage III Width: (cm) 0.2 Depth: (cm) 0.3 Volume: (cm) 0.014 Character of Wound/Ulcer Post Debridement: Stable Post Procedure Diagnosis Same as Pre-procedure Electronic Signature(s) Signed: 09/14/2023 5:18:10 PM By: Allen Derry PA-C Signed: 09/15/2023 9:06:19 AM By: Yevonne Pax RN Signed: 09/15/2023 5:15:40 PM By: Betha Loa Entered By: Betha Loa on 09/14/2023 13:16:51 -------------------------------------------------------------------------------- HPI Details Patient Name: Date of Service: [name] 09/14/2023 12:45 PM Medical Record Number: [id number] Patient Account Number: [id number] Date of Birth/Sex: Treating RN: 12-01-2005 (18 y.o. 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 He wears an AFO brace on both feet. Noteworthy that he was here in 2021 for an area on the left foot He tells me that he has had a wound on the plantar right heel for the better part of a year. Prior to this this may have opened and closed repetitively. He has not been specifically dressing this and he became alarmed when it would not close and also that there may have been an odor noticeable. He is here for  a review of this. 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 ABI in our clinic was 1.22 on the right 09-14-23 upon evaluation today patient appears to be doing well currently in regard to his wound although he  does have some callus buildup and the endoform is getting dried up in the wound bed. I do feel like that this is keeping her from being able to do the final closure. Nonetheless I feel like we can work on getting this in better shape and moving in the right direction. I think he is still very close to being closed which is excellent news. Electronic Signature(s) Signed: 09/14/2023 1:46:20 PM By: Allen Derry PA-C Entered By: Allen Derry on 09/14/2023 13:46:20 [name] ([id number]) [id number] Page 3 of 7 -------------------------------------------------------------------------------- Physical Exam Details Patient Name: Date of Service: [name] 09/14/2023 12:45 PM Medical Record Number: [id number] Patient Account Number: [id number] Date of Birth/Sex: Treating RN: Oct 04, 2005 (18 y.o. [name]) Yevonne Pax Primary Care Provider: PA Zenovia Jordan, NO Other Clinician: Betha Loa Referring Provider: Treating Provider/Extender: Elizebeth Brooking, KRISTA Weeks in Treatment: 12 Constitutional Well-nourished and well-hydrated in no acute distress. Respiratory normal breathing without difficulty. Psychiatric this patient is able to make decisions and demonstrates good insight into disease process. Alert and Oriented x 3. pleasant and cooperative. Notes Upon inspection patient's wound bed actually showed signs of excellent granulation and epithelization at this point. I do not see any signs of active infection which is great news and in general I do believe that her making good headway here towards closure. Electronic Signature(s) Signed: 09/14/2023  1:46:33 PM By: Allen Derry PA-C Entered By: Allen Derry on 09/14/2023 13:46:33 -------------------------------------------------------------------------------- Physician Orders Details Patient Name: Date of Service: [name] 09/14/2023 12:45 PM Medical Record Number: [id number] Patient Account Number: [id number] Date of Birth/Sex: Treating RN: 05-19-2006 (17 y.o. 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 [name] ([id number]) [id number] Page 4 of 7 Bathing/ Shower/ Hygiene Clean wound with Normal Saline or wound cleanser. Anesthetic (Use 'Patient Medications' Section for Anesthetic Order Entry) Lidocaine applied to wound bed Off-Loading Heel suspension boot Wound Treatment Wound #2 - Calcaneus Wound Laterality: Right Cleanser: Soap and Water Every Other Day/30 Days Discharge Instructions: Gently cleanse wound with antibacterial soap, rinse and pat dry prior to dressing wounds Cleanser: Vashe 5.8 (oz) Every Other Day/30 Days Discharge Instructions: Use vashe 5.8 (oz) as directed Prim Dressing: Promogran Matrix 4.34 (in) Every Other Day/30 Days ary Discharge Instructions: Moisten w/normal saline or sterile water; Cover wound as directed. Do not remove from wound bed. Prim Dressing: Xeroform-HBD 2x2 (in/in) Every Other Day/30 Days ary Discharge Instructions: Apply Xeroform-HBD 2x2 (in/in) as directed Secondary Dressing: Coverlet Latex-Free Fabric Adhesive Dressings Every Other  Day/30 Days Discharge Instructions: 1.5 x 2 Electronic Signature(s) Signed: 09/14/2023 5:18:10 PM By: Allen Derry PA-C Signed: 09/15/2023 5:15:40 PM By: Betha Loa Entered By: Betha Loa on 09/14/2023 14:03:39 -------------------------------------------------------------------------------- Problem List Details Patient Name: Date of Service: [name] 09/14/2023 12:45 PM Medical Record Number: [id number] Patient Account Number: [id number] Date of Birth/Sex: Treating RN: [birth year] (17 y.o. [name] Primary Care Provider: PA Zenovia Jordan, NO Other Clinician: Betha Loa Referring Provider: Treating Provider/Extender: Elizebeth Brooking, KRISTA Weeks in Treatment: 12 Active Problems ICD-10 Encounter Code Description Active Date MDM Diagnosis L89.613 Pressure ulcer  of right heel, stage 3 06/17/2023 No Yes Q05.7 Lumbar spina bifida without hydrocephalus 06/17/2023 No Yes Inactive Problems Resolved Problems ANVIT, PASQUAL ([id number]) [id number] Page 5 of 7 Electronic Signature(s) Signed: 09/14/2023 12:47:40 PM By: Allen Derry PA-C Entered By: Allen Derry on 09/14/2023 12:47:39 -------------------------------------------------------------------------------- Progress Note Details Patient Name: Date of Service: [name] 09/14/2023 12:45 PM Medical Record Number: [id number] Patient Account Number: [id number] Date of Birth/Sex: Treating RN: 06/07/2006 (17 y.o. 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 He wears an AFO brace on both feet. Noteworthy that he was here in 2021 for an area on the left foot He tells me that he has had a wound on the plantar right heel for the better part of a year. Prior to this this may have opened and closed repetitively. He has not been specifically dressing this and he became alarmed when it would not close and also that there may have been an odor noticeable. He is here for a review of this. 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 ABI in our clinic was 1.22 on the right 09-14-23 upon evaluation today patient appears to be doing well currently in regard to his wound although he does have some callus buildup and the endoform is getting dried up in the wound bed. I do feel like that this is keeping her from being able to do the final closure. Nonetheless I feel like we can work on getting this in better shape and moving in the right direction. I think he is still very close to being closed which is excellent news. Objective Constitutional Well-nourished and well-hydrated in no acute distress. Vitals Time Taken: 12:49 PM, Height: 67 in, Weight: 130 lbs, BMI: 20.4, Temperature: 97.8 F, Pulse: 80 bpm, Respiratory Rate: 18 breaths/min, Blood Pressure: 129/80 mmHg. Respiratory normal breathing without difficulty. Psychiatric this patient is able to make decisions and demonstrates good insight into disease process. Alert and Oriented x 3. pleasant and cooperative. General Notes: Upon inspection patient's wound bed actually showed signs of excellent granulation and epithelization at this point. I do not see any signs  of active infection which is great news and in general I do believe that her making good headway here towards closure. Integumentary (Hair, Skin) Wound #2 status is Open. Original cause of wound was Pressure Injury. The date acquired was: 06/16/2022. The wound has been in treatment 12 weeks. The wound is located on the Right Calcaneus. The wound measures 0.3cm length x 0.2cm width x 0.3cm depth; 0.047cm^2 area and 0.014cm^3 volume. There is SAMIIR, LOEHRER ([id number]) [id number] Page 6 of 7 Fat Layer (Subcutaneous Tissue) exposed. There is a medium amount of sanguinous drainage noted. There is large (67-100%) red, pink granulation within the wound bed. There is no necrotic tissue within the wound  bed. Assessment Active Problems ICD-10 Pressure ulcer of right heel, stage 3 Lumbar spina bifida without hydrocephalus Procedures Wound #2 Pre-procedure diagnosis of Wound #2 is a Pressure Ulcer located on the Right Calcaneus . There was a Selective/Open Wound Non-Viable Tissue Debridement with a total area of 0.05 sq cm performed by Allen Derry, PA-C. With the following instrument(s): Curette to remove Viable and Non-Viable tissue/material. Material removed includes Callus. A time out was conducted at 13:16, prior to the start of the procedure. A Minimum amount of bleeding was controlled with Pressure. The procedure was tolerated well. Post Debridement Measurements: 0.3cm length x 0.2cm width x 0.3cm depth; 0.014cm^3 volume. Post debridement Stage noted as Category/Stage III. Character of Wound/Ulcer Post Debridement is stable. Post procedure Diagnosis Wound #2: Same as Pre-Procedure Plan Follow-up Appointments: Return Appointment in 1 week. Bathing/ Shower/ Hygiene: Clean wound with Normal Saline or wound cleanser. Anesthetic (Use 'Patient Medications' Section for Anesthetic Order Entry): Lidocaine applied to wound bed Off-Loading: Heel suspension boot WOUND #2:  - Calcaneus Wound Laterality: Right Cleanser: Soap and Water Every Other Day/30 Days Discharge Instructions: Gently cleanse wound with antibacterial soap, rinse and pat dry prior to dressing wounds Cleanser: Vashe 5.8 (oz) Every Other Day/30 Days Discharge Instructions: Use vashe 5.8 (oz) as directed Prim Dressing: Promogran Matrix 4.34 (in) Every Other Day/30 Days ary Discharge Instructions: Moisten w/normal saline or sterile water; Cover wound as directed. Do not remove from wound bed. Prim Dressing: Xeroform-HBD 2x2 (in/in) Every Other Day/30 Days ary Discharge Instructions: Apply Xeroform-HBD 2x2 (in/in) as directed Secondary Dressing: ABD Pad 5x9 (in/in) Every Other Day/30 Days Discharge Instructions: heel cup Secondary Dressing: Coverlet Latex-Free Fabric Adhesive Dressings Every Other Day/30 Days Discharge Instructions: 1.5 x 2 1. I would recommend that we have the patient continue to monitor for any signs of infection or worsening. Based on what I see I do believe that we will making really good headway here towards closure postdebridement this seems to be doing much better. 2. I am also going to recommend that the patient should continue to monitor for any signs of infection and if anything changes he knows to contact the office let me know regularly using regular collagen followed by Xeroform and then a large Band-Aid to cover. We will see patient back for reevaluation in 1 week here in the clinic. If anything worsens or changes patient will contact our office for additional recommendations. Electronic Signature(s) Signed: 09/14/2023 1:47:15 PM By: Allen Derry PA-C Entered By: Allen Derry on 09/14/2023 13:47:15 [name] ([id number]) [id number] Page 7 of 7 -------------------------------------------------------------------------------- SuperBill Details Patient Name: Date of Service: [name] 09/14/2023 Medical Record Number:  [id number] Patient Account Number: [id number] Date of Birth/Sex: Treating RN: 2006/08/25 (17 y.o. 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

## 2023-09-14 NOTE — Progress Notes (Addendum)
[name] ([id number]) [id number] Page 1 of 8 Visit Report for 09/14/2023 Arrival Information Details Patient Name: Date of Service: [name] 09/14/2023 12:45 PM Medical Record Number: [id number] Patient Account Number: [id number] Date of Birth/Sex: Treating RN: Jan 12, 2006 (17 y.o. [name]) Yevonne Pax Primary Care Tonia Avino: PA Zenovia Jordan, NO Other Clinician: Betha Loa Referring Shandra Szymborski: Treating Nichola Cieslinski/Extender: Barnabas Harries Weeks in Treatment: 12 Visit Information History Since Last Visit All ordered tests and consults were completed: No Patient Arrived: Ambulatory Added or deleted any medications: No Arrival Time: 12:46 Any Avery allergies or adverse reactions: No Transfer Assistance: None Had a fall or experienced change in No Patient Identification Verified: Yes activities of daily living that may affect Secondary Verification Process Completed: Yes risk of falls: Patient Requires Transmission-Based Precautions: No Signs or symptoms of abuse/neglect since last visito No Patient Has Alerts: No Hospitalized since last visit: No Implantable device outside of the clinic excluding No cellular tissue based products placed in the center since last visit: Has Dressing in Place as Prescribed: Yes Has Footwear/Offloading in Place as Prescribed: Yes Right: Wedge Shoe Pain Present Now: No Electronic Signature(s) Signed: 09/15/2023 5:15:40 PM By: Betha Loa Entered By: Betha Loa on 09/14/2023 12:49:23 -------------------------------------------------------------------------------- Clinic Level of Care Assessment Details Patient Name: Date of Service: [name] 09/14/2023 12:45 PM Medical Record Number: [id number] Patient Account Number: [id number] Date of Birth/Sex: Treating RN: [birth year] (17 y.o. [name]) Yevonne Pax Primary Care Royale Swamy: PA Zenovia Jordan, NO Other Clinician: Betha Loa Referring Amahri Dengel: Treating  Gavriella Hearst/Extender: Elizebeth Brooking, KRISTA Weeks in Treatment: 12 Clinic Level of Care Assessment Items TOOL 1 Quantity Score []  - 0 Use when EandM and Procedure is performed on INITIAL visit ASSESSMENTS - Nursing Assessment / Reassessment []  - 0 General Physical Exam (combine w/ comprehensive assessment (listed just below) when performed on Avery pt. [address] ([id number]) [id number] Page 2 of 8 []  - 0 Comprehensive Assessment (HX, ROS, Risk Assessments, Wounds Hx, etc.) ASSESSMENTS - Wound and Skin Assessment / Reassessment []  - 0 Dermatologic / Skin Assessment (not related to wound area) ASSESSMENTS - Ostomy and/or Continence Assessment and Care []  - 0 Incontinence Assessment and Management []  - 0 Ostomy Care Assessment and Management (repouching, etc.) PROCESS - Coordination of Care []  - 0 Simple Patient / Family Education for ongoing care []  - 0 Complex (extensive) Patient / Family Education for ongoing care []  - 0 Staff obtains Chiropractor, Records, T Results / Process Orders est []  - 0 Staff telephones HHA, Nursing Homes / Clarify orders / etc []  - 0 Routine Transfer to another Facility (non-emergent condition) []  - 0 Routine Hospital Admission (non-emergent condition) []  - 0 Avery Admissions / Manufacturing engineer / Ordering NPWT Apligraf, etc. , []  - 0 Emergency Hospital Admission (emergent condition) PROCESS - Special Needs []  - 0 Pediatric / Minor Patient Management []  - 0 Isolation Patient Management []  - 0 Hearing / Language / Visual special needs []  - 0 Assessment of Community assistance (transportation, D/C planning, etc.) []  - 0 Additional assistance / Altered mentation []  - 0 Support Surface(s) Assessment (bed, cushion, seat, etc.) INTERVENTIONS - Miscellaneous []  - 0 External ear exam []  - 0 Patient Transfer (multiple staff / Nurse, adult / Similar devices) []  - 0 Simple Staple / Suture removal (25 or  less) []  - 0 Complex Staple / Suture removal (26 or more) []  - 0 Hypo/Hyperglycemic Management (do not check if billed separately) []  - 0 Ankle /  Brachial Index (ABI) - do not check if billed separately Has the patient been seen at the hospital within the last three years: Yes Total Score: 0 Level Of Care: ____ Electronic Signature(s) Signed: 09/15/2023 5:15:40 PM By: Betha Loa Entered By: Betha Loa on 09/14/2023 13:20:11 -------------------------------------------------------------------------------- Encounter Discharge Information Details Patient Name: Date of Service: [name] 09/14/2023 12:45 PM Medical Record Number: [id number] Patient Account Number: [id number] Date of Birth/Sex: Treating RN: 06-27-2006 ([address] y.o. [name] [place], [name] ([id number]) [id number] Page 3 of 8 Primary Care Rosita Guzzetta: PA Zenovia Jordan, NO Other Clinician: Betha Loa Referring Jese Comella: Treating Kermitt Harjo/Extender: Elizebeth Brooking, KRISTA Weeks in Treatment: 12 Encounter Discharge Information Items Post Procedure Vitals Discharge Condition: Stable Temperature (F): 97.8 Ambulatory Status: Ambulatory Pulse (bpm): 80 Discharge Destination: Home Respiratory Rate (breaths/min): 18 Transportation: Private Auto Blood Pressure (mmHg): 129/80 Accompanied By: self Schedule Follow-up Appointment: Yes Clinical Summary of Care: Electronic Signature(s) Signed: 09/15/2023 5:15:40 PM By: Betha Loa Entered By: Betha Loa on 09/14/2023 13:32:52 -------------------------------------------------------------------------------- Lower Extremity Assessment Details Patient Name: Date of Service: [name] 09/14/2023 12:45 PM Medical Record Number: [id number] Patient Account Number: [id number] Date of Birth/Sex: Treating RN: 2006-02-15 (17 y.o. [name]) Yevonne Pax Primary Care Tayten Heber: PA Zenovia Jordan, NO Other Clinician: Betha Loa Referring  Krishawna Stiefel: Treating Arlynn Stare/Extender: Elizebeth Brooking, KRISTA Weeks in Treatment: 12 Electronic Signature(s) Signed: 09/15/2023 9:06:19 AM By: Yevonne Pax RN Signed: 09/15/2023 5:15:40 PM By: Betha Loa Entered By: Betha Loa on 09/14/2023 13:00:30 -------------------------------------------------------------------------------- Multi Wound Chart Details Patient Name: Date of Service: [name] 09/14/2023 12:45 PM Medical Record Number: [id number] Patient Account Number: [id number] Date of Birth/Sex: Treating RN: Jan 09, 2006 (17 y.o. [name] Primary Care Mende Biswell: PA Zenovia Jordan, NO Other Clinician: Betha Loa Referring Lewin Pellow: Treating Saban Heinlen/Extender: Elizebeth Brooking, KRISTA Weeks in Treatment: 12 Vital Signs Height(in): 67 Pulse(bpm): 80 Weight(lbs): 130 Blood Pressure(mmHg): 129/80 Body Mass Index(BMI): 20.4 Temperature(F): 97.8 Respiratory Rate(breaths/min): 18 [place], [name] ([id number]) [phone].pdf Page 4 of 8 [2:Photos:] [N/A:N/A] Right Calcaneus N/A N/A Wound Location: Pressure Injury N/A N/A Wounding Event: Pressure Ulcer N/A N/A Primary Etiology: 06/16/2022 N/A N/A Date Acquired: 12 N/A N/A Weeks of Treatment: Open N/A N/A Wound Status: No N/A N/A Wound Recurrence: 0.3x0.2x0.3 N/A N/A Measurements L x W x D (cm) 0.047 N/A N/A A (cm) : rea 0.014 N/A N/A Volume (cm) : 94.30% N/A N/A % Reduction in A rea: 97.20% N/A N/A % Reduction in Volume: Category/Stage III N/A N/A Classification: Medium N/A N/A Exudate A mount: Sanguinous N/A N/A Exudate Type: red N/A N/A Exudate Color: Large (67-100%) N/A N/A Granulation A mount: Red, Pink N/A N/A Granulation Quality: None Present (0%) N/A N/A Necrotic A mount: Fat Layer (Subcutaneous Tissue): Yes N/A N/A Exposed Structures: Fascia: No Tendon: No Muscle: No Joint: No Bone: No Medium (34-66%) N/A N/A Epithelialization: Treatment  Notes Electronic Signature(s) Signed: 09/15/2023 5:15:40 PM By: Betha Loa Entered By: Betha Loa on 09/14/2023 13:00:39 -------------------------------------------------------------------------------- Multi-Disciplinary Care Plan Details Patient Name: Date of Service: [name] 09/14/2023 12:45 PM Medical Record Number: [id number] Patient Account Number: [id number] Date of Birth/Sex: Treating RN: [birth year] (17 y.o. [name] Primary Care Jalila Goodnough: PA Fidela Juneau Other Clinician: Betha Loa Referring Mose Colaizzi: Treating Cam Harnden/Extender: Elizebeth Brooking, KRISTA Weeks in Treatment: 12 Active Inactive Wound/Skin Impairment Nursing Diagnoses: Impaired tissue integrity Knowledge deficit related to ulceration/compromised skin integrity [name] ([id number]) [phone].pdf Page 5 of 8 Goals: Patient/caregiver will verbalize  understanding of skin care regimen Date Initiated: 06/18/2023 Date Inactivated: 07/22/2023 Target Resolution Date: 07/18/2023 Goal Status: Met Ulcer/skin breakdown will have a volume reduction of 30% by week 4 Date Initiated: 06/18/2023 Date Inactivated: 07/22/2023 Target Resolution Date: 07/18/2023 Goal Status: Met Ulcer/skin breakdown will have a volume reduction of 50% by week 8 Date Initiated: 06/18/2023 Date Inactivated: 08/31/2023 Target Resolution Date: 08/17/2023 Goal Status: Met Ulcer/skin breakdown will have a volume reduction of 80% by week 12 Date Initiated: 06/18/2023 Target Resolution Date: 09/17/2023 Goal Status: Active Ulcer/skin breakdown will heal within 14 weeks Date Initiated: 06/18/2023 Target Resolution Date: 10/01/2023 Goal Status: Active Interventions: Assess patient/caregiver ability to obtain necessary supplies Assess patient/caregiver ability to perform ulcer/skin care regimen upon admission and as needed Assess ulceration(s) every visit Provide education on ulcer and skin  care Treatment Activities: Skin care regimen initiated : 06/17/2023 Notes: Electronic Signature(s) Signed: 09/15/2023 9:06:19 AM By: Yevonne Pax RN Signed: 09/15/2023 5:15:40 PM By: Betha Loa Entered By: Betha Loa on 09/14/2023 13:20:21 -------------------------------------------------------------------------------- Pain Assessment Details Patient Name: Date of Service: [name] 09/14/2023 12:45 PM Medical Record Number: [id number] Patient Account Number: [id number] Date of Birth/Sex: Treating RN: [birth year] (17 y.o. [name] Primary Care Eliab Closson: PA Fidela Juneau Other Clinician: Betha Loa Referring Kaye Luoma: Treating Danya Spearman/Extender: Elizebeth Brooking, KRISTA Weeks in Treatment: 12 Active Problems Location of Pain Severity and Description of Pain Patient Has Paino No Site Locations [place], [name] ([id number]) [id number] Page 6 of 8 Pain Management and Medication Current Pain Management: Electronic Signature(s) Signed: 09/15/2023 9:06:19 AM By: Yevonne Pax RN Signed: 09/15/2023 5:15:40 PM By: Betha Loa Entered By: Betha Loa on 09/14/2023 12:53:51 -------------------------------------------------------------------------------- Patient/Caregiver Education Details Patient Name: Date of Service: [name] 1/20/2025andnbsp12:45 PM Medical Record Number: [id number] Patient Account Number: [id number] Date of Birth/Gender: Treating RN: 2005/11/29 (17 y.o. [name] Primary Care Physician: PA Zenovia Jordan, NO Other Clinician: Betha Loa Referring Physician: Treating Physician/Extender: Barnabas Harries Weeks in Treatment: 12 Education Assessment Education Provided To: Patient Education Topics Provided Wound/Skin Impairment: Handouts: Other: continue wound care as directed Methods: Explain/Verbal Responses: State content correctly Electronic Signature(s) Signed: 09/15/2023 5:15:40 PM  By: Betha Loa Entered By: Betha Loa on 09/14/2023 13:31:54 [name] ([id number]) [id number] Page 7 of 8 -------------------------------------------------------------------------------- Wound Assessment Details Patient Name: Date of Service: [name] 09/14/2023 12:45 PM Medical Record Number: [id number] Patient Account Number: [id number] Date of Birth/Sex: Treating RN: Dec 20, 2005 (17 y.o. [name]) Yevonne Pax Primary Care Akasha Melena: PA Zenovia Jordan, NO Other Clinician: Betha Loa Referring Lametria Klunk: Treating Oluwademilade Kellett/Extender: Elizebeth Brooking, KRISTA Weeks in Treatment: 12 Wound Status Wound Number: 2 Primary Etiology: Pressure Ulcer Wound Location: Right Calcaneus Wound Status: Open Wounding Event: Pressure Injury Date Acquired: 06/16/2022 Weeks Of Treatment: 12 Clustered Wound: No Photos Wound Measurements Length: (cm) 0.3 Width: (cm) 0.2 Depth: (cm) 0.3 Area: (cm) 0.047 Volume: (cm) 0.014 % Reduction in Area: 94.3% % Reduction in Volume: 97.2% Epithelialization: Medium (34-66%) Wound Description Classification: Category/Stage III Exudate Amount: Medium Exudate Type: Sanguinous Exudate Color: red Foul Odor After Cleansing: No Slough/Fibrino No Wound Bed Granulation Amount: Large (67-100%) Exposed Structure Granulation Quality: Red, Pink Fascia Exposed: No Necrotic Amount: None Present (0%) Fat Layer (Subcutaneous Tissue) Exposed: Yes Tendon Exposed: No Muscle Exposed: No Joint Exposed: No Bone Exposed: No Treatment Notes Wound #2 (Calcaneus) Wound Laterality: Right Cleanser Soap and Water Discharge Instruction: Gently cleanse wound with antibacterial soap, rinse and pat dry prior to dressing  wounds Vashe 5.8 (oz) [name] ([id number]) [id number] Page 8 of 8 Discharge Instruction: Use vashe 5.8 (oz) as directed Peri-Wound Care Topical Primary Dressing Promogran Matrix 4.34  (in) Discharge Instruction: Moisten w/normal saline or sterile water; Cover wound as directed. Do not remove from wound bed. Xeroform-HBD 2x2 (in/in) Discharge Instruction: Apply Xeroform-HBD 2x2 (in/in) as directed Secondary Dressing ABD Pad 5x9 (in/in) Discharge Instruction: heel cup Coverlet Latex-Free Fabric Adhesive Dressings Discharge Instruction: 1.5 x 2 Secured With Compression Wrap Compression Stockings Add-Ons Electronic Signature(s) Signed: 09/15/2023 9:06:19 AM By: Yevonne Pax RN Signed: 09/15/2023 5:15:40 PM By: Betha Loa Entered By: Betha Loa on 09/14/2023 13:00:13 -------------------------------------------------------------------------------- Vitals Details Patient Name: Date of Service: [name] 09/14/2023 12:45 PM Medical Record Number: [id number] Patient Account Number: [id number] Date of Birth/Sex: Treating RN: 05/14/2006 (17 y.o. [name]) Yevonne Pax Primary Care Shyam Dawson: PA Zenovia Jordan, NO Other Clinician: Betha Loa Referring Quaneshia Wareing: Treating Osker Ayoub/Extender: Elizebeth Brooking, KRISTA Weeks in Treatment: 12 Vital Signs Time Taken: 12:49 Temperature (F): 97.8 Height (in): 67 Pulse (bpm): 80 Weight (lbs): 130 Respiratory Rate (breaths/min): 18 Body Mass Index (BMI): 20.4 Blood Pressure (mmHg): 129/80 Reference Range: 80 - 120 mg / dl Electronic Signature(s) Signed: 09/15/2023 5:15:40 PM By: Betha Loa Entered By: Betha Loa on 09/14/2023 12:53:46

## 2023-09-21 ENCOUNTER — Encounter: Payer: Medicaid Other | Admitting: Physician Assistant

## 2023-09-21 DIAGNOSIS — L89613 Pressure ulcer of right heel, stage 3: Secondary | ICD-10-CM | POA: Diagnosis not present

## 2023-09-28 ENCOUNTER — Encounter: Payer: Medicaid Other | Attending: Physician Assistant | Admitting: Physician Assistant

## 2023-09-28 DIAGNOSIS — Q057 Lumbar spina bifida without hydrocephalus: Secondary | ICD-10-CM | POA: Insufficient documentation

## 2023-09-28 DIAGNOSIS — L89613 Pressure ulcer of right heel, stage 3: Secondary | ICD-10-CM | POA: Diagnosis present

## 2023-10-12 ENCOUNTER — Encounter: Payer: Medicaid Other | Admitting: Physician Assistant

## 2023-10-12 DIAGNOSIS — L89613 Pressure ulcer of right heel, stage 3: Secondary | ICD-10-CM | POA: Diagnosis not present

## 2024-01-11 ENCOUNTER — Ambulatory Visit: Payer: Medicaid Other | Admitting: Physician Assistant

## 2024-04-19 ENCOUNTER — Encounter: Attending: Internal Medicine | Admitting: Internal Medicine

## 2024-04-19 DIAGNOSIS — Q0701 Arnold-Chiari syndrome with spina bifida: Secondary | ICD-10-CM | POA: Diagnosis not present

## 2024-04-19 DIAGNOSIS — M21371 Foot drop, right foot: Secondary | ICD-10-CM | POA: Diagnosis not present

## 2024-04-19 DIAGNOSIS — L89612 Pressure ulcer of right heel, stage 2: Secondary | ICD-10-CM | POA: Diagnosis present

## 2024-04-19 DIAGNOSIS — Z981 Arthrodesis status: Secondary | ICD-10-CM | POA: Diagnosis not present

## 2024-04-27 ENCOUNTER — Encounter: Admitting: Physician Assistant

## 2024-05-03 ENCOUNTER — Encounter: Attending: Physician Assistant | Admitting: Physician Assistant

## 2024-05-03 DIAGNOSIS — Q0703 Arnold-Chiari syndrome with spina bifida and hydrocephalus: Secondary | ICD-10-CM | POA: Insufficient documentation

## 2024-05-03 DIAGNOSIS — Z981 Arthrodesis status: Secondary | ICD-10-CM | POA: Insufficient documentation

## 2024-05-03 DIAGNOSIS — Q6671 Congenital pes cavus, right foot: Secondary | ICD-10-CM | POA: Diagnosis not present

## 2024-05-03 DIAGNOSIS — L89613 Pressure ulcer of right heel, stage 3: Secondary | ICD-10-CM | POA: Insufficient documentation

## 2024-05-10 ENCOUNTER — Ambulatory Visit: Admitting: Physician Assistant

## 2024-05-17 ENCOUNTER — Encounter: Admitting: Physician Assistant

## 2024-05-17 DIAGNOSIS — L89613 Pressure ulcer of right heel, stage 3: Secondary | ICD-10-CM | POA: Diagnosis not present

## 2024-05-24 ENCOUNTER — Encounter: Admitting: Physician Assistant

## 2024-05-24 DIAGNOSIS — L89613 Pressure ulcer of right heel, stage 3: Secondary | ICD-10-CM | POA: Diagnosis not present

## 2024-05-31 ENCOUNTER — Encounter: Attending: Physician Assistant | Admitting: Physician Assistant

## 2024-05-31 DIAGNOSIS — Q059 Spina bifida, unspecified: Secondary | ICD-10-CM | POA: Diagnosis not present

## 2024-05-31 DIAGNOSIS — L89613 Pressure ulcer of right heel, stage 3: Secondary | ICD-10-CM | POA: Insufficient documentation

## 2024-05-31 DIAGNOSIS — M21371 Foot drop, right foot: Secondary | ICD-10-CM | POA: Insufficient documentation

## 2024-06-07 ENCOUNTER — Encounter: Admitting: Physician Assistant

## 2024-06-07 DIAGNOSIS — L89613 Pressure ulcer of right heel, stage 3: Secondary | ICD-10-CM | POA: Diagnosis not present

## 2024-06-14 ENCOUNTER — Encounter: Admitting: Physician Assistant

## 2024-06-14 DIAGNOSIS — L89613 Pressure ulcer of right heel, stage 3: Secondary | ICD-10-CM | POA: Diagnosis not present

## 2024-06-21 ENCOUNTER — Encounter: Admitting: Physician Assistant

## 2024-06-24 ENCOUNTER — Encounter: Admitting: Physician Assistant

## 2024-07-01 ENCOUNTER — Encounter: Attending: Internal Medicine | Admitting: Internal Medicine

## 2024-07-01 ENCOUNTER — Other Ambulatory Visit: Payer: Self-pay | Admitting: Physician Assistant

## 2024-07-01 DIAGNOSIS — M21371 Foot drop, right foot: Secondary | ICD-10-CM | POA: Insufficient documentation

## 2024-07-01 DIAGNOSIS — Q059 Spina bifida, unspecified: Secondary | ICD-10-CM | POA: Insufficient documentation

## 2024-07-01 DIAGNOSIS — L89613 Pressure ulcer of right heel, stage 3: Secondary | ICD-10-CM | POA: Insufficient documentation

## 2024-07-01 DIAGNOSIS — L089 Local infection of the skin and subcutaneous tissue, unspecified: Secondary | ICD-10-CM

## 2024-07-11 ENCOUNTER — Encounter: Admitting: Physician Assistant

## 2024-07-19 ENCOUNTER — Encounter: Admitting: Physician Assistant

## 2024-07-26 ENCOUNTER — Encounter: Admitting: Physician Assistant

## 2024-07-26 DIAGNOSIS — Q059 Spina bifida, unspecified: Secondary | ICD-10-CM | POA: Diagnosis not present

## 2024-07-26 DIAGNOSIS — R2689 Other abnormalities of gait and mobility: Secondary | ICD-10-CM | POA: Insufficient documentation

## 2024-07-26 DIAGNOSIS — L89622 Pressure ulcer of left heel, stage 2: Secondary | ICD-10-CM | POA: Insufficient documentation

## 2024-07-26 DIAGNOSIS — L89613 Pressure ulcer of right heel, stage 3: Secondary | ICD-10-CM | POA: Diagnosis present

## 2024-08-05 ENCOUNTER — Encounter: Admitting: Internal Medicine

## 2024-08-10 ENCOUNTER — Encounter: Admitting: Internal Medicine

## 2024-08-10 DIAGNOSIS — L89613 Pressure ulcer of right heel, stage 3: Secondary | ICD-10-CM | POA: Diagnosis not present

## 2024-08-22 ENCOUNTER — Encounter: Admitting: Physician Assistant

## 2024-08-22 DIAGNOSIS — L89613 Pressure ulcer of right heel, stage 3: Secondary | ICD-10-CM | POA: Diagnosis not present

## 2024-09-02 ENCOUNTER — Encounter: Attending: Physician Assistant | Admitting: Physician Assistant

## 2024-09-02 DIAGNOSIS — M21371 Foot drop, right foot: Secondary | ICD-10-CM | POA: Insufficient documentation

## 2024-09-02 DIAGNOSIS — L89622 Pressure ulcer of left heel, stage 2: Secondary | ICD-10-CM | POA: Insufficient documentation

## 2024-09-02 DIAGNOSIS — R2689 Other abnormalities of gait and mobility: Secondary | ICD-10-CM | POA: Diagnosis not present

## 2024-09-02 DIAGNOSIS — Q059 Spina bifida, unspecified: Secondary | ICD-10-CM | POA: Insufficient documentation

## 2024-09-02 DIAGNOSIS — L89613 Pressure ulcer of right heel, stage 3: Secondary | ICD-10-CM | POA: Diagnosis present

## 2024-09-09 ENCOUNTER — Encounter: Admitting: Physician Assistant

## 2024-09-09 DIAGNOSIS — L89613 Pressure ulcer of right heel, stage 3: Secondary | ICD-10-CM | POA: Diagnosis not present

## 2024-09-19 ENCOUNTER — Encounter: Admitting: Physician Assistant

## 2024-09-22 ENCOUNTER — Encounter: Admitting: Physician Assistant

## 2024-09-22 DIAGNOSIS — L89613 Pressure ulcer of right heel, stage 3: Secondary | ICD-10-CM | POA: Diagnosis not present

## 2024-09-29 ENCOUNTER — Encounter: Admitting: Physician Assistant

## 2024-10-05 ENCOUNTER — Encounter: Admitting: Physician Assistant
